# Patient Record
Sex: Male | Born: 1955 | Race: Black or African American | Hispanic: No | Marital: Single | Smoking: Never smoker
Health system: Southern US, Community
[De-identification: ages and names within clinical notes are randomized; demographics above are authoritative.]

## PROBLEM LIST (undated history)

## (undated) DIAGNOSIS — J45909 Unspecified asthma, uncomplicated: Secondary | ICD-10-CM

## (undated) DIAGNOSIS — I10 Essential (primary) hypertension: Secondary | ICD-10-CM

## (undated) DIAGNOSIS — M1990 Unspecified osteoarthritis, unspecified site: Secondary | ICD-10-CM

## (undated) DIAGNOSIS — E119 Type 2 diabetes mellitus without complications: Secondary | ICD-10-CM

## (undated) HISTORY — DX: Unspecified asthma, uncomplicated: J45.909

## (undated) HISTORY — DX: Essential (primary) hypertension: I10

## (undated) HISTORY — DX: Unspecified osteoarthritis, unspecified site: M19.90

## (undated) HISTORY — DX: Type 2 diabetes mellitus without complications: E11.9

## (undated) HISTORY — PX: OTHER SURGICAL HISTORY: SHX169

---

## 2004-01-18 ENCOUNTER — Emergency Department (HOSPITAL_COMMUNITY): Admission: EM | Admit: 2004-01-18 | Discharge: 2004-01-18 | Payer: Self-pay | Admitting: Emergency Medicine

## 2018-03-24 DIAGNOSIS — K219 Gastro-esophageal reflux disease without esophagitis: Secondary | ICD-10-CM | POA: Insufficient documentation

## 2018-06-02 DIAGNOSIS — R1310 Dysphagia, unspecified: Secondary | ICD-10-CM | POA: Insufficient documentation

## 2018-06-03 ENCOUNTER — Other Ambulatory Visit: Payer: Self-pay | Admitting: Physician Assistant

## 2018-06-03 DIAGNOSIS — R1310 Dysphagia, unspecified: Secondary | ICD-10-CM

## 2018-06-09 ENCOUNTER — Ambulatory Visit
Admission: RE | Admit: 2018-06-09 | Discharge: 2018-06-09 | Disposition: A | Discharge status: Home / Self Care | Payer: Managed Care, Other (non HMO) | Source: Ambulatory Visit | Attending: Physician Assistant | Admitting: Physician Assistant

## 2018-06-09 DIAGNOSIS — R1310 Dysphagia, unspecified: Secondary | ICD-10-CM

## 2018-06-18 ENCOUNTER — Other Ambulatory Visit: Payer: Self-pay | Admitting: Physician Assistant

## 2018-06-18 DIAGNOSIS — R1310 Dysphagia, unspecified: Secondary | ICD-10-CM

## 2018-06-30 ENCOUNTER — Ambulatory Visit
Admission: RE | Admit: 2018-06-30 | Discharge: 2018-06-30 | Disposition: A | Discharge status: Home / Self Care | Payer: Managed Care, Other (non HMO) | Source: Ambulatory Visit | Attending: Physician Assistant | Admitting: Physician Assistant

## 2018-06-30 DIAGNOSIS — R1310 Dysphagia, unspecified: Secondary | ICD-10-CM

## 2018-06-30 MED ORDER — IOPAMIDOL (ISOVUE-300) INJECTION 61%
75.0000 mL | Freq: Once | INTRAVENOUS | Status: AC | PRN
Start: 2018-06-30 — End: 2018-06-30
  Administered 2018-06-30: 75 mL via INTRAVENOUS

## 2020-02-29 ENCOUNTER — Ambulatory Visit: Payer: Self-pay | Admitting: Cardiology

## 2020-03-04 NOTE — Progress Notes (Signed)
Patient referred by Shanon Rosser, PA-C for chest pain  Subjective:   Geoffrey Williams, male    DOB: 1955-11-06, 64 y.o.   MRN: [id number]   Chief Complaint  Patient presents with  . Chest Pain  . New Patient (Initial Visit)     HPI  64 y.o. African American male with hypertension, hyperlipidemia, family h/o CAD, referred for evaluation of chest pain.  Patient was seen at [facility] urgent care on 02/16/2020 with complaints of chest pain. Patient works as a Patent attorney. On 02/16/2020, he had episodes of retrosternal and left sided chest discomfort, unrelated to exertion, associated with shortness of breath. Episodes were self limiting. He generally felt unwell that day. He has not had any recurrence of these symptoms. He does endorse exertional dyspnea.  He has strong family h/o CAD with multiple family members undergoing CABG at early age. He is on lisinopril 10 and amlodipine 5 mg with generally well controlled blood pressures. He was recently started on Crestor for management of hyperlipidemia.    Past Medical History:  Diagnosis Date  . Hypertension      Past Surgical History:  Procedure Laterality Date  . arm surgery       Social History   Tobacco Use  Smoking Status Never Smoker  Smokeless Tobacco Never Used    Social History   Substance and Sexual Activity  Alcohol Use Not Currently     Family History  Problem Relation Age of Onset  . Hypertension Mother   . Dementia Mother   . Hypertension Father   . Diabetes Father   . CAD Father   . CAD Brother   . Cancer Sister   . CAD Brother   . Parkinson's disease Brother   . Diabetes Brother      Current Outpatient Medications on File Prior to Visit  Medication Sig Dispense Refill  . amLODipine (NORVASC) 5 MG tablet Take 5 mg by mouth daily.    Marland Kitchen lisinopril (ZESTRIL) 10 MG tablet Take 10 mg by mouth daily.    . nitroGLYCERIN (NITROSTAT) 0.4 MG SL tablet SMARTSIG:1 Tablet(s) Sublingual PRN     . rosuvastatin (CRESTOR) 10 MG tablet Take 10 mg by mouth daily.     No current facility-administered medications on file prior to visit.    Cardiovascular and other pertinent studies:  EKG 03/07/2020: Sinus rhythm 64 bpm Normal EKG  Chest x-ray 02/18/2020: Normal heart size.  Blunting of right costophrenic angle consistent with either pleural scarring or pleural effusion.  Pleural plaque posteriorly which would be supportive of chronic pleural scarring. No definite evidence of CHF.    Recent labs: 02/16/2020: Glucose 154, BUN/Cr 11/1.17. EGFR 76. Na/K 139/4.5. Rest of the CMP normal H/H 15.9/46.5. MCV 88.7. Platelets 232 HbA1C N/A Chol 221, TG 210, HDL 42, LDL 144 TSH N/A   Review of Systems  Cardiovascular: Positive for chest pain and dyspnea on exertion. Negative for leg swelling, palpitations and syncope.         Vitals:   03/07/20 1340  BP: (!) 148/80  Pulse: 65  SpO2: 99%     Body mass index is 25.07 kg/m. Filed Weights   03/07/20 1340  Weight: 190 lb (86.2 kg)     Objective:   Physical Exam Vitals and nursing note reviewed.  Constitutional:      General: He is not in acute distress. Neck:     Vascular: No JVD.  Cardiovascular:     Rate and Rhythm:  Normal rate and regular rhythm.     Heart sounds: Normal heart sounds. No murmur heard.   Pulmonary:     Effort: Pulmonary effort is normal.     Breath sounds: Normal breath sounds. No wheezing or rales.            Assessment & Recommendations:    64 y.o. African American male with hypertension, hyperlipidemia, family h/o CAD, referred for evaluation of chest pain.  Chest pain: Atypical angina symptoms. Multiple risk factors for CAD, including hypertension, hyperlipidemia, family h/o CAD Recommend exercise nuclear stress test, echocardiogram, CT cardiac scoring Continue current antihypertensive and lipid lowering therapy, SL NTG, added Aspirin 81 mg   Further recommendations after  above testing  Thank you for referring the patient to [place]. Please feel free to contact with any questions.   [name], MD Pager: [phone] Office: [phone]

## 2020-03-07 ENCOUNTER — Other Ambulatory Visit: Payer: Self-pay

## 2020-03-07 ENCOUNTER — Encounter: Payer: Self-pay | Admitting: Cardiology

## 2020-03-07 ENCOUNTER — Ambulatory Visit: Payer: Managed Care, Other (non HMO) | Admitting: Cardiology

## 2020-03-07 VITALS — BP 148/80 | HR 65 | Ht 73.0 in | Wt 190.0 lb

## 2020-03-07 DIAGNOSIS — R0609 Other forms of dyspnea: Secondary | ICD-10-CM

## 2020-03-07 DIAGNOSIS — R072 Precordial pain: Secondary | ICD-10-CM | POA: Insufficient documentation

## 2020-03-07 DIAGNOSIS — E782 Mixed hyperlipidemia: Secondary | ICD-10-CM

## 2020-03-07 DIAGNOSIS — Z8249 Family history of ischemic heart disease and other diseases of the circulatory system: Secondary | ICD-10-CM

## 2020-03-07 DIAGNOSIS — I208 Other forms of angina pectoris: Secondary | ICD-10-CM

## 2020-03-07 DIAGNOSIS — R0600 Dyspnea, unspecified: Secondary | ICD-10-CM

## 2020-03-07 MED ORDER — ASPIRIN EC 81 MG PO TBEC: 81.0000 mg | DELAYED_RELEASE_TABLET | Freq: Every day | ORAL | 3 refills | Status: DC

## 2020-03-14 ENCOUNTER — Other Ambulatory Visit: Payer: Self-pay

## 2020-03-14 ENCOUNTER — Ambulatory Visit: Payer: Managed Care, Other (non HMO)

## 2020-03-14 DIAGNOSIS — R0600 Dyspnea, unspecified: Secondary | ICD-10-CM

## 2020-03-14 DIAGNOSIS — R0609 Other forms of dyspnea: Secondary | ICD-10-CM

## 2020-03-21 ENCOUNTER — Other Ambulatory Visit: Payer: Self-pay

## 2020-03-21 ENCOUNTER — Ambulatory Visit: Payer: Managed Care, Other (non HMO)

## 2020-03-21 DIAGNOSIS — I208 Other forms of angina pectoris: Secondary | ICD-10-CM

## 2020-03-30 ENCOUNTER — Encounter: Payer: Self-pay | Admitting: Cardiology

## 2020-03-30 ENCOUNTER — Ambulatory Visit: Payer: Managed Care, Other (non HMO) | Admitting: Cardiology

## 2020-03-30 ENCOUNTER — Other Ambulatory Visit: Payer: Self-pay

## 2020-03-30 VITALS — BP 168/95 | HR 56 | Resp 16 | Ht 73.0 in | Wt 191.0 lb

## 2020-03-30 DIAGNOSIS — R0600 Dyspnea, unspecified: Secondary | ICD-10-CM

## 2020-03-30 DIAGNOSIS — I10 Essential (primary) hypertension: Secondary | ICD-10-CM

## 2020-03-30 DIAGNOSIS — I2510 Atherosclerotic heart disease of native coronary artery without angina pectoris: Secondary | ICD-10-CM

## 2020-03-30 DIAGNOSIS — J984 Other disorders of lung: Secondary | ICD-10-CM

## 2020-03-30 DIAGNOSIS — R0609 Other forms of dyspnea: Secondary | ICD-10-CM

## 2020-03-30 MED ORDER — HYDROCHLOROTHIAZIDE 25 MG PO TABS: 25.0000 mg | ORAL_TABLET | Freq: Every day | ORAL | 3 refills | Status: DC

## 2020-03-30 MED ORDER — ROSUVASTATIN CALCIUM 20 MG PO TABS: 20.0000 mg | ORAL_TABLET | Freq: Every day | ORAL | 2 refills | Status: DC

## 2020-03-30 MED ORDER — AMLODIPINE BESYLATE 5 MG PO TABS: 5.0000 mg | ORAL_TABLET | Freq: Every day | ORAL | 2 refills | Status: DC

## 2020-03-30 NOTE — Progress Notes (Signed)
Patient referred by No ref. provider found for chest pain  Subjective:   Geoffrey Williams, male    DOB: 31-Mar-1956, 64 y.o.   MRN: [id number]   Chief Complaint  Patient presents with  . Follow-up  . Results  . Precordial pain     HPI  64 y.o. African American male with hypertension, hyperlipidemia, family h/o CAD, referred for evaluation of chest pain.  Patient has not had any recurrence of chest pain since his last visit.  He does report exertional dyspnea, which usually correlates with him getting sick with Covid and late 2020.  Blood pressure remains elevated.  He is not currently taking amlodipine since he ran out.  He is taking lisinopril 10 mg daily.  Recent CT scan, echocardiogram results reviewed with the patient, details below.    Initial consultation HPI 02/2020: Patient was seen at [facility] urgent care on 02/16/2020 with complaints of chest pain. Patient works as a Patent attorney. On 02/16/2020, he had episodes of retrosternal and left sided chest discomfort, unrelated to exertion, associated with shortness of breath. Episodes were self limiting. He generally felt unwell that day. He has not had any recurrence of these symptoms. He does endorse exertional dyspnea.  He has strong family h/o CAD with multiple family members undergoing CABG at early age. He is on lisinopril 10 and amlodipine 5 mg with generally well controlled blood pressures. He was recently started on Crestor for management of hyperlipidemia.     Current Outpatient Medications on File Prior to Visit  Medication Sig Dispense Refill  . aspirin EC 81 MG tablet Take 1 tablet (81 mg total) by mouth daily. Swallow whole. 30 tablet 3  . lisinopril (ZESTRIL) 10 MG tablet Take 10 mg by mouth daily.    . nitroGLYCERIN (NITROSTAT) 0.4 MG SL tablet SMARTSIG:1 Tablet(s) Sublingual PRN    . rosuvastatin (CRESTOR) 10 MG tablet Take 10 mg by mouth daily.    Marland Kitchen amLODipine (NORVASC) 5 MG tablet Take 5 mg by mouth  daily.     No current facility-administered medications on file prior to visit.    Cardiovascular and other pertinent studies:  Exercise Sestamibi Stress Test 03/21/2020: Equivocal ECG stress. The patient exercised for 7 minutes and 0 seconds of a Bruce protocol, achieving approximately 8.15 METs. Stress terminated due to fatigue and dyspnea.  Peak BP was 252/82 mmHg, which is a hypertensive response to exercise. Peak EKG/ECG demonstrated normal sinus rhythm. Non-specific ST-T abnormality. During exercise the peak ECG revealed premature ventricular contractions, occasional ventricular couplets and 2 ventricular triplets. No ST-T changes of ischemia. Myocardial perfusion is normal. Overall LV systolic function is normal without regional wall motion abnormalities. Stress LV EF: 63%.  No previous exam available for comparison. Low risk study. Clinical correlation recommended.   Echocardiogram 03/14/2020:  Left ventricle cavity is normal in size and wall thickness. Normal global  wall motion. Normal LV systolic function with EF 68%. Normal diastolic  filling pattern.  Structurally normal trileaflet aortic valve. Trace aortic regurgitation.  Mild tricuspid regurgitation.  No evidence of pulmonary hypertension.  No significant change compared to previous study in 2018.   CT Cardiac scoring 03/07/2020: Total score: 84.  LM: 33.  LAD: 36.  Cx: 15.  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   EKG  03/07/2020: Sinus rhythm 64 bpm Normal EKG  Chest x-ray 02/18/2020: Normal heart size.  Blunting of right costophrenic angle consistent with either pleural scarring or pleural effusion.  Pleural plaque posteriorly which would be supportive of chronic pleural scarring. No definite  evidence of CHF.    Recent labs: 02/16/2020: Glucose 154, BUN/Cr 11/1.17. EGFR 76. Na/K 139/4.5. Rest of the CMP normal H/H 15.9/46.5. MCV 88.7. Platelets 232 HbA1C N/A Chol 221, TG 210, HDL 42, LDL 144 TSH N/A   Review of Systems  Cardiovascular: Positive for chest pain and dyspnea on exertion. Negative for leg swelling, palpitations and syncope.         Vitals:   03/30/20 1545 03/30/20 1547  BP: (!) 175/92 (!) 168/95  Pulse: (!) 58 (!) 56  Resp: 16   SpO2: 97% 96%     Body mass index is 25.2 kg/m. Filed Weights   03/30/20 1545  Weight: 191 lb (86.6 kg)     Objective:   Physical Exam Vitals and nursing note reviewed.  Constitutional:      General: He is not in acute distress. Neck:     Vascular: No JVD.  Cardiovascular:     Rate and Rhythm: Normal rate and regular rhythm.     Heart sounds: Normal heart sounds. No murmur heard.   Pulmonary:     Effort: Pulmonary effort is normal.     Breath sounds: Normal breath sounds. No wheezing or rales.            Assessment & Recommendations:    64 y.o. African American male with hypertension, hyperlipidemia, coronary artery disease without ischemia, exertional dyspnea.  Exertional dyspnea: Could be multifactorial.  While he does have coronary artery disease, as evident on CT cardiac scoring, he does not have any ischemia on stress testing, suggesting nonobstructive coronary artery disease.  He does have pulmonary calcifications, raising the possibility of asbestos exposure. Other differential could be long COVID. Will refer him to pulmonology.   CAD without angina: Continue Aspirin 81 mg. Increase Crestor to 20 mg daily.  Repeat lipid panel in 4 weeks  Hypertension: Uncontrolled. Change lisinopril 10 mg to HCTZ 25 mg, resume amlodipine 5 mg. Will recheck blood pressure at next visit.     [name], MD Pager: [phone] Office: [phone]

## 2020-04-04 ENCOUNTER — Ambulatory Visit: Payer: Managed Care, Other (non HMO) | Admitting: Cardiology

## 2020-04-06 ENCOUNTER — Ambulatory Visit: Payer: Managed Care, Other (non HMO) | Admitting: Cardiology

## 2020-04-20 LAB — LIPID PANEL
Chol/HDL Ratio: 3.4 ratio (ref 0.0–5.0)
Cholesterol, Total: 120 mg/dL (ref 100–199)
HDL: 35 mg/dL — ABNORMAL LOW (ref >39)
LDL Chol Calc (NIH): 62 mg/dL (ref 0–99)
Triglycerides: 131 mg/dL (ref 0–149)
VLDL Cholesterol Cal: 23 mg/dL (ref 5–40)

## 2020-04-29 NOTE — Progress Notes (Signed)
Patient referred by No ref. provider found for chest pain  Subjective:   Geoffrey Williams, male    DOB: 1955/08/22, 65 y.o.   MRN: [id number]   Chief Complaint  Patient presents with   Coronary Artery Disease     HPI  65 y.o. African American male with hypertension, hyperlipidemia, coronary artery disease without ischemia, exertional dyspnea.  Reviewed lipid panel results with the patient, details below. He has not had any recurrent chest pain, shortness of breath symptoms. Blood pressure is better controlled.    Current Outpatient Medications on File Prior to Visit  Medication Sig Dispense Refill   amLODipine (NORVASC) 5 MG tablet Take 1 tablet (5 mg total) by mouth daily. 60 tablet 2   aspirin EC 81 MG tablet Take 1 tablet (81 mg total) by mouth daily. Swallow whole. 30 tablet 3   hydrochlorothiazide (HYDRODIURIL) 25 MG tablet Take 1 tablet (25 mg total) by mouth daily. 60 tablet 3   nitroGLYCERIN (NITROSTAT) 0.4 MG SL tablet SMARTSIG:1 Tablet(s) Sublingual PRN     rosuvastatin (CRESTOR) 20 MG tablet Take 1 tablet (20 mg total) by mouth daily. 60 tablet 2   No current facility-administered medications on file prior to visit.    Cardiovascular and other pertinent studies:  Exercise Sestamibi Stress Test 03/21/2020: Equivocal ECG stress. The patient exercised for 7 minutes and 0 seconds of a Bruce protocol, achieving approximately 8.15 METs. Stress terminated due to fatigue and dyspnea.  Peak BP was 252/82 mmHg, which is a hypertensive response to exercise. Peak EKG/ECG demonstrated normal sinus rhythm. Non-specific ST-T abnormality. During exercise the peak ECG revealed premature ventricular contractions, occasional ventricular couplets and 2 ventricular triplets. No ST-T changes of ischemia. Myocardial perfusion is normal. Overall LV systolic function is normal without regional wall motion abnormalities. Stress LV EF: 63%.  No previous exam available for comparison. Low  risk study. Clinical correlation recommended.   Echocardiogram 03/14/2020:  Left ventricle cavity is normal in size and wall thickness. Normal global  wall motion. Normal LV systolic function with EF 68%. Normal diastolic  filling pattern.  Structurally normal trileaflet aortic valve. Trace aortic regurgitation.  Mild tricuspid regurgitation.  No evidence of pulmonary hypertension.  No significant change compared to previous study in 2018.   CT Cardiac scoring 03/07/2020: Total score: 84.  LM: 33.  LAD: 36.  Cx: 15.  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   EKG 03/07/2020: Sinus rhythm 64 bpm Normal EKG  Chest x-ray 02/18/2020: Normal heart size.  Blunting of right costophrenic angle consistent with either pleural scarring or pleural effusion.  Pleural plaque posteriorly which would be supportive of chronic pleural scarring. No definite evidence of CHF.    Recent labs: 04/19/2020: Chol 120 TG 131, HDL 35, LDL 62  02/16/2020: Glucose 154, BUN/Cr 11/1.17. EGFR 76. Na/K 139/4.5. Rest of the CMP normal H/H 15.9/46.5. MCV 88.7. Platelets 232 HbA1C N/A Chol 221, TG 210, HDL 42, LDL 144 TSH N/A   Review of Systems  Cardiovascular: Positive for chest pain and dyspnea on exertion. Negative for leg swelling, palpitations and syncope.         Vitals:   05/02/20 1452  BP: 127/71  Pulse: 63  SpO2: 98%     Body mass index is 25.07 kg/m. Filed Weights   05/02/20  1452  Weight: 190 lb (86.2 kg)     Objective:   Physical Exam Vitals and nursing note reviewed.  Constitutional:      General: He is not in acute distress. Neck:     Vascular: No JVD.  Cardiovascular:     Rate and Rhythm: Normal rate and regular rhythm.     Heart sounds: Normal heart  sounds. No murmur heard.   Pulmonary:     Effort: Pulmonary effort is normal.     Breath sounds: Normal breath sounds. No wheezing or rales.            Assessment & Recommendations:    65 y.o. African American male with hypertension, hyperlipidemia, coronary artery disease without ischemia, exertional dyspnea.  Exertional dyspnea: Improved. CAD seen on CT cardiac scoring, without ischemia on stress testing s/o nonobstructive CAD. Will refer to pulmonology given incidental finding of pleural calcification.  CAD without angina: Continue Aspirin 81 mg. Increase Crestor to 20 mg daily.  Lipids now well controlled.   Hypertension: Much better controlled.  F/u in 6 months   [name], MD Pager: [phone] Office: [phone]

## 2020-05-02 ENCOUNTER — Encounter: Payer: Self-pay | Admitting: Cardiology

## 2020-05-02 ENCOUNTER — Ambulatory Visit: Payer: Managed Care, Other (non HMO) | Admitting: Cardiology

## 2020-05-02 ENCOUNTER — Other Ambulatory Visit: Payer: Self-pay

## 2020-05-02 VITALS — BP 127/71 | HR 63 | Ht 73.0 in | Wt 190.0 lb

## 2020-05-02 DIAGNOSIS — I208 Other forms of angina pectoris: Secondary | ICD-10-CM

## 2020-05-02 DIAGNOSIS — I2510 Atherosclerotic heart disease of native coronary artery without angina pectoris: Secondary | ICD-10-CM

## 2020-05-02 DIAGNOSIS — I10 Essential (primary) hypertension: Secondary | ICD-10-CM

## 2020-05-02 DIAGNOSIS — E782 Mixed hyperlipidemia: Secondary | ICD-10-CM

## 2020-05-02 DIAGNOSIS — J948 Other specified pleural conditions: Secondary | ICD-10-CM

## 2020-05-02 MED ORDER — ASPIRIN EC 81 MG PO TBEC: 81.0000 mg | DELAYED_RELEASE_TABLET | Freq: Every day | ORAL | 3 refills | Status: DC

## 2020-08-15 ENCOUNTER — Encounter: Payer: Self-pay | Admitting: Pulmonary Disease

## 2020-08-15 ENCOUNTER — Ambulatory Visit: Payer: Managed Care, Other (non HMO) | Admitting: Pulmonary Disease

## 2020-08-15 ENCOUNTER — Other Ambulatory Visit: Payer: Self-pay

## 2020-08-15 VITALS — BP 122/78 | HR 77 | Temp 97.5°F | Ht 73.0 in | Wt 180.0 lb

## 2020-08-15 DIAGNOSIS — R0600 Dyspnea, unspecified: Secondary | ICD-10-CM | POA: Diagnosis not present

## 2020-08-15 DIAGNOSIS — M2550 Pain in unspecified joint: Secondary | ICD-10-CM | POA: Insufficient documentation

## 2020-08-15 DIAGNOSIS — F329 Major depressive disorder, single episode, unspecified: Secondary | ICD-10-CM | POA: Insufficient documentation

## 2020-08-15 DIAGNOSIS — F32A Depression, unspecified: Secondary | ICD-10-CM | POA: Insufficient documentation

## 2020-08-15 DIAGNOSIS — R0609 Other forms of dyspnea: Secondary | ICD-10-CM

## 2020-08-15 DIAGNOSIS — E559 Vitamin D deficiency, unspecified: Secondary | ICD-10-CM | POA: Insufficient documentation

## 2020-08-15 DIAGNOSIS — J0140 Acute pansinusitis, unspecified: Secondary | ICD-10-CM | POA: Insufficient documentation

## 2020-08-15 DIAGNOSIS — J309 Allergic rhinitis, unspecified: Secondary | ICD-10-CM | POA: Insufficient documentation

## 2020-08-15 NOTE — Progress Notes (Signed)
Geoffrey Williams    [id number]    14-Oct-1955  Primary Care Physician:Patient, No Pcp Per (Inactive)  Referring Physician: Elder Negus, MD [address] [place],  [place] [address]  Chief complaint: Consult for abnormal lung imaging, post COVID-24  HPI: 65 year old with history of childhood asthma, hypertension Referred here for evaluation of pleural calcifications found on recent CT chest.  Has history of childhood asthma, history of COVID-11 April 2019.  He was not hospitalized for this Had some mild dyspnea on exertion after feels which has improved but continues to have some residual symptoms.  Denies any cough, wheezing, sputum production  Pets: No pets Occupation: Estate agent Exposures: No mold, hot tub, Jacuzzi.  No feather pillows or comforters Smoking history: Never smoker Travel history: No significant travel history Relevant family history: No family history of lung disease   Outpatient Encounter Medications as of 08/15/2020  Medication Sig  . amLODipine (NORVASC) 5 MG tablet Take 1 tablet (5 mg total) by mouth daily.  Marland Kitchen aspirin EC 81 MG tablet Take 1 tablet (81 mg total) by mouth daily. Swallow whole.  . hydrochlorothiazide (HYDRODIURIL) 25 MG tablet Take 1 tablet (25 mg total) by mouth daily.  Marland Kitchen lisinopril (ZESTRIL) 10 MG tablet Take 10 mg by mouth daily.  . nitroGLYCERIN (NITROSTAT) 0.4 MG SL tablet SMARTSIG:1 Tablet(s) Sublingual PRN  . rosuvastatin (CRESTOR) 20 MG tablet Take 1 tablet (20 mg total) by mouth daily.   No facility-administered encounter medications on file as of 08/15/2020.    Allergies as of 08/15/2020  . (No Known Allergies)    Past Medical History:  Diagnosis Date  . Asthma   . Hypertension     Past Surgical History:  Procedure Laterality Date  . arm surgery Right     Family History  Problem Relation Age of Onset  . Hypertension Mother   . Dementia Mother   . Hypertension Father   .  Diabetes Father   . CAD Father   . CAD Brother   . Cancer Sister   . CAD Brother   . Parkinson's disease Brother   . Diabetes Brother     Social History   Socioeconomic History  . Marital status: Single    Spouse name: Not on file  . Number of children: Not on file  . Years of education: Not on file  . Highest education level: Not on file  Occupational History  . Not on file  Tobacco Use  . Smoking status: Never Smoker  . Smokeless tobacco: Never Used  Vaping Use  . Vaping Use: Never used  Substance and Sexual Activity  . Alcohol use: Not Currently  . Drug use: Not Currently    Types: Marijuana  . Sexual activity: Yes  Other Topics Concern  . Not on file  Social History Narrative  . Not on file   Social Determinants of Health   Financial Resource Strain: Not on file  Food Insecurity: Not on file  Transportation Needs: Not on file  Physical Activity: Not on file  Stress: Not on file  Social Connections: Not on file  Intimate Partner Violence: Not on file    Review of systems: Review of Systems  Constitutional: Negative for fever and chills.  HENT: Negative.   Eyes: Negative for blurred vision.  Respiratory: as per HPI  Cardiovascular: Negative for chest pain and palpitations.  Gastrointestinal: Negative for vomiting, diarrhea, blood per rectum. Genitourinary: Negative for  dysuria, urgency, frequency and hematuria.  Musculoskeletal: Negative for myalgias, back pain and joint pain.  Skin: Negative for itching and rash.  Neurological: Negative for dizziness, tremors, focal weakness, seizures and loss of consciousness.  Endo/Heme/Allergies: Negative for environmental allergies.  Psychiatric/Behavioral: Negative for depression, suicidal ideas and hallucinations.  All other systems reviewed and are negative.  Physical Exam: Blood pressure 122/78, pulse 77, temperature (!) 97.5 F (36.4 C), temperature source Temporal, height 6\' 1"  (1.854 m), weight 180 lb (81.6  kg), SpO2 99 %. Gen:      No acute distress HEENT:  EOMI, sclera anicteric Neck:     No masses; no thyromegaly Lungs:    Clear to auscultation bilaterally; normal respiratory effort CV:         Regular rate and rhythm; no murmurs Abd:      + bowel sounds; soft, non-tender; no palpable masses, no distension Ext:    No edema; adequate peripheral perfusion Skin:      Warm and dry; no rash Neuro: alert and oriented x 3 Psych: normal mood and affect  Data Reviewed: Imaging: CT coronaries [facility] 03/07/2020- by report bilateral pleural calcifications suggestive of asbestos exposure.  PFTs:  Labs:  Assessment:  Abnormal CT By report he has pleural calcification suggestive of asbestos exposure though he denies any known known exposure History notable For childhood asthma which he grew out of and COVID in 2020  We will schedule high-resolution CT and PFTs for better evaluation Follow-up in clinic after these tests for review.  Plan/Recommendations: High-res CT, PFTs  2021 MD [facility] 08/15/2020, 11:00 AM  CC: Patwardhan, 08/17/2020, MD

## 2020-08-15 NOTE — Patient Instructions (Signed)
We will get a CT chest high-resolution and lung function test for further evaluation for lung Follow-up in clinic after these tests for review and assessment

## 2020-08-24 ENCOUNTER — Other Ambulatory Visit: Payer: Managed Care, Other (non HMO)

## 2020-08-29 ENCOUNTER — Other Ambulatory Visit: Payer: Self-pay

## 2020-08-29 ENCOUNTER — Ambulatory Visit (INDEPENDENT_AMBULATORY_CARE_PROVIDER_SITE_OTHER)
Admission: RE | Admit: 2020-08-29 | Discharge: 2020-08-29 | Disposition: A | Discharge status: Home / Self Care | Payer: Managed Care, Other (non HMO) | Source: Ambulatory Visit | Attending: Pulmonary Disease | Admitting: Pulmonary Disease

## 2020-08-29 DIAGNOSIS — R0600 Dyspnea, unspecified: Secondary | ICD-10-CM | POA: Diagnosis not present

## 2020-08-29 DIAGNOSIS — R0609 Other forms of dyspnea: Secondary | ICD-10-CM

## 2020-09-07 ENCOUNTER — Telehealth: Payer: Self-pay | Admitting: Pulmonary Disease

## 2020-09-07 NOTE — Telephone Encounter (Signed)
lmtcb for pt.    CT shows mild scarring and calcium buildup in the lining of the lung. 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 No treatment recommended at current time  Will review in detail after he follows up in clinic with PFTs

## 2020-09-07 NOTE — Progress Notes (Signed)
LMTCB

## 2020-09-08 NOTE — Telephone Encounter (Signed)
Lmtcb for pt.  

## 2020-09-08 NOTE — Telephone Encounter (Signed)
Patient returned call, advised him of results/recommendations of CT scan per Dr. Isaiah Serge.  He verbalized understanding.  Nothing further needed.

## 2020-10-14 ENCOUNTER — Other Ambulatory Visit (HOSPITAL_COMMUNITY)
Admission: RE | Admit: 2020-10-14 | Discharge: 2020-10-14 | Disposition: A | Discharge status: Home / Self Care | Payer: Managed Care, Other (non HMO) | Source: Ambulatory Visit | Attending: Pulmonary Disease | Admitting: Pulmonary Disease

## 2020-10-14 DIAGNOSIS — Z20822 Contact with and (suspected) exposure to covid-19: Secondary | ICD-10-CM | POA: Diagnosis not present

## 2020-10-14 DIAGNOSIS — Z01812 Encounter for preprocedural laboratory examination: Secondary | ICD-10-CM | POA: Diagnosis not present

## 2020-10-14 LAB — SARS CORONAVIRUS 2 (TAT 6-24 HRS): SARS Coronavirus 2: NEGATIVE

## 2020-10-17 ENCOUNTER — Encounter: Payer: Self-pay | Admitting: Pulmonary Disease

## 2020-10-17 ENCOUNTER — Ambulatory Visit (INDEPENDENT_AMBULATORY_CARE_PROVIDER_SITE_OTHER): Payer: Managed Care, Other (non HMO) | Admitting: Pulmonary Disease

## 2020-10-17 ENCOUNTER — Ambulatory Visit: Payer: Managed Care, Other (non HMO) | Admitting: Pulmonary Disease

## 2020-10-17 ENCOUNTER — Other Ambulatory Visit: Payer: Self-pay

## 2020-10-17 VITALS — BP 124/80 | HR 66 | Ht 73.0 in | Wt 180.0 lb

## 2020-10-17 DIAGNOSIS — J61 Pneumoconiosis due to asbestos and other mineral fibers: Secondary | ICD-10-CM | POA: Diagnosis not present

## 2020-10-17 DIAGNOSIS — R0600 Dyspnea, unspecified: Secondary | ICD-10-CM | POA: Diagnosis not present

## 2020-10-17 DIAGNOSIS — J849 Interstitial pulmonary disease, unspecified: Secondary | ICD-10-CM | POA: Diagnosis not present

## 2020-10-17 DIAGNOSIS — R0609 Other forms of dyspnea: Secondary | ICD-10-CM

## 2020-10-17 LAB — PULMONARY FUNCTION TEST
DL/VA % pred: 122 %
DL/VA: 5.06 ml/min/mmHg/L
DLCO cor % pred: 73 %
DLCO cor: 20.72 ml/min/mmHg
DLCO unc % pred: 73 %
DLCO unc: 20.72 ml/min/mmHg
FEF 25-75 Post: 2.7 L/sec
FEF 25-75 Pre: 2.33 L/sec
FEF2575-%Change-Post: 15 %
FEF2575-%Pred-Post: 92 %
FEF2575-%Pred-Pre: 79 %
FEV1-%Change-Post: 4 %
FEV1-%Pred-Post: 74 %
FEV1-%Pred-Pre: 71 %
FEV1-Post: 2.42 L
FEV1-Pre: 2.31 L
FEV1FVC-%Change-Post: 5 %
FEV1FVC-%Pred-Pre: 107 %
FEV6-%Change-Post: -1 %
FEV6-%Pred-Post: 67 %
FEV6-%Pred-Pre: 68 %
FEV6-Post: 2.74 L
FEV6-Pre: 2.77 L
FEV6FVC-%Pred-Post: 103 %
FEV6FVC-%Pred-Pre: 103 %
FVC-%Change-Post: -1 %
FVC-%Pred-Post: 65 %
FVC-%Pred-Pre: 66 %
FVC-Post: 2.74 L
FVC-Pre: 2.77 L
Post FEV1/FVC ratio: 88 %
Post FEV6/FVC ratio: 100 %
Pre FEV1/FVC ratio: 83 %
Pre FEV6/FVC Ratio: 100 %
RV % pred: 71 %
RV: 1.71 L
TLC % pred: 61 %
TLC: 4.5 L

## 2020-10-17 NOTE — Progress Notes (Signed)
PFT done today. 

## 2020-10-17 NOTE — Patient Instructions (Signed)
I have reviewed your CT scan and PFTs which show some scarring which may be from asbestos exposure We will continue to monitor this closely Follow-up high-res CT in 6 months Return to clinic after CT scan

## 2020-10-17 NOTE — Progress Notes (Signed)
Geoffrey Williams    [id number]    [birth year]  Primary Care Physician:Patient, No Pcp Per (Inactive)  Referring Physician: No referring provider defined for this encounter.  Chief complaint: Consult for abnormal lung imaging, post COVID-59  HPI: 65 year old with history of childhood asthma, hypertension Referred here for evaluation of pleural calcifications found on recent CT chest.  Has history of childhood asthma, history of COVID-11 April 2019.  He was not hospitalized for this Had some mild dyspnea on exertion after feels which has improved but continues to have some residual symptoms.  Denies any cough, wheezing, sputum production  Pets: No pets Occupation: Estate agent Exposures: No mold, hot tub, Jacuzzi.  No feather pillows or comforters Smoking history: Never smoker Travel history: No significant travel history Relevant family history: No family history of lung disease   Outpatient Encounter Medications as of 10/17/2020  Medication Sig   amLODipine (NORVASC) 5 MG tablet Take 1 tablet (5 mg total) by mouth daily.   aspirin EC 81 MG tablet Take 1 tablet (81 mg total) by mouth daily. Swallow whole.   lisinopril (ZESTRIL) 10 MG tablet Take 10 mg by mouth daily.   nitroGLYCERIN (NITROSTAT) 0.4 MG SL tablet SMARTSIG:1 Tablet(s) Sublingual PRN   rosuvastatin (CRESTOR) 20 MG tablet Take 1 tablet (20 mg total) by mouth daily.   [DISCONTINUED] hydrochlorothiazide (HYDRODIURIL) 25 MG tablet Take 1 tablet (25 mg total) by mouth daily.   No facility-administered encounter medications on file as of 10/17/2020.    Allergies as of 10/17/2020   (No Known Allergies)    Past Medical History:  Diagnosis Date   Asthma    Hypertension     Past Surgical History:  Procedure Laterality Date   arm surgery Right     Family History  Problem Relation Age of Onset   Hypertension Mother    Dementia Mother    Hypertension Father    Diabetes Father    CAD Father     CAD Brother    Cancer Sister    CAD Brother    Parkinson's disease Brother    Diabetes Brother     Social History   Socioeconomic History   Marital status: Single    Spouse name: Not on file   Number of children: Not on file   Years of education: Not on file   Highest education level: Not on file  Occupational History   Not on file  Tobacco Use   Smoking status: Never   Smokeless tobacco: Never  Vaping Use   Vaping Use: Never used  Substance and Sexual Activity   Alcohol use: Not Currently   Drug use: Not Currently    Types: Marijuana   Sexual activity: Yes  Other Topics Concern   Not on file  Social History Narrative   Not on file   Social Determinants of Health   Financial Resource Strain: Not on file  Food Insecurity: Not on file  Transportation Needs: Not on file  Physical Activity: Not on file  Stress: Not on file  Social Connections: Not on file  Intimate Partner Violence: Not on file    Review of systems: Review of Systems  Constitutional: Negative for fever and chills.  HENT: Negative.   Eyes: Negative for blurred vision.  Respiratory: as per HPI  Cardiovascular: Negative for chest pain and palpitations.  Gastrointestinal: Negative for vomiting, diarrhea, blood per rectum. Genitourinary: Negative for dysuria, urgency, frequency and hematuria.  Musculoskeletal: Negative  for myalgias, back pain and joint pain.  Skin: Negative for itching and rash.  Neurological: Negative for dizziness, tremors, focal weakness, seizures and loss of consciousness.  Endo/Heme/Allergies: Negative for environmental allergies.  Psychiatric/Behavioral: Negative for depression, suicidal ideas and hallucinations.  All other systems reviewed and are negative.  Physical Exam: Blood pressure 122/78, pulse 77, temperature (!) 97.5 F (36.4 C), temperature source Temporal, height 6\' 1"  (1.854 m), weight 180 lb (81.6 kg), SpO2 99 %. Gen:      No acute distress HEENT:  EOMI,  sclera anicteric Neck:     No masses; no thyromegaly Lungs:    Clear to auscultation bilaterally; normal respiratory effort CV:         Regular rate and rhythm; no murmurs Abd:      + bowel sounds; soft, non-tender; no palpable masses, no distension Ext:    No edema; adequate peripheral perfusion Skin:      Warm and dry; no rash Neuro: alert and oriented x 3 Psych: normal mood and affect  Data Reviewed: Imaging: CT coronaries [facility] 03/07/2020- by report bilateral pleural calcifications suggestive of asbestos exposure.  PFTs: 10/17/2020 FVC 2.74 [65%], FEV1 2.42 [74%], F/F 88, TLC 4.50 [61%], DLCO 20.72 [73%] Moderate restriction with minimal diffusion defect  Labs:  Assessment:  Asbestosis His CT scan shows pleural calcification and scarring at the base suggestive of asbestos exposure though he denies any known known exposure History notable for childhood asthma which he grew out of and COVID in 2020  Reviewed PFTs with restriction and diffusion impairment No treatment needed at present unless there is progression of fibrosis Follow-up with high-res CT in 6 months  Plan/Recommendations: High-res CT in 6 months  2021 MD [facility] 10/17/2020, 11:13 AM  CC: No ref. provider found

## 2020-10-24 ENCOUNTER — Other Ambulatory Visit: Payer: Self-pay | Admitting: Cardiology

## 2020-10-24 DIAGNOSIS — I10 Essential (primary) hypertension: Secondary | ICD-10-CM

## 2020-10-31 ENCOUNTER — Ambulatory Visit: Payer: Managed Care, Other (non HMO) | Admitting: Cardiology

## 2020-11-21 ENCOUNTER — Ambulatory Visit: Payer: Managed Care, Other (non HMO) | Admitting: Cardiology

## 2020-11-21 ENCOUNTER — Other Ambulatory Visit: Payer: Self-pay

## 2020-11-21 ENCOUNTER — Encounter: Payer: Self-pay | Admitting: Cardiology

## 2020-11-21 VITALS — BP 150/81 | HR 65 | Temp 97.8°F | Resp 16 | Ht 73.0 in | Wt 170.0 lb

## 2020-11-21 DIAGNOSIS — I10 Essential (primary) hypertension: Secondary | ICD-10-CM

## 2020-11-21 DIAGNOSIS — I2510 Atherosclerotic heart disease of native coronary artery without angina pectoris: Secondary | ICD-10-CM

## 2020-11-21 DIAGNOSIS — E782 Mixed hyperlipidemia: Secondary | ICD-10-CM

## 2020-11-21 NOTE — Progress Notes (Signed)
Subjective:   Kary Kos, male    DOB: November 27, 1955, 65 y.o.   MRN: [id number]   Chief Complaint  Patient presents with   Coronary artery disease involving native coronary artery of   Hypertension   Follow-up    6 MONTH     HPI  65 y.o. African American male with hypertension, hyperlipidemia, coronary artery disease   Patient is doing well, he has not had any recent chest pain symptoms.  His exertional dyspnea is mild and occasional.  He saw pulmonology given abnormal findings on CT scan suggesting possible asbestos exposure.  Blood pressure is elevated today, but usually well controlled.  2 months ago, patient had an episode of fast heart rate while laying in bed at night.  This episode has not recurred.  Current Outpatient Medications on File Prior to Visit  Medication Sig Dispense Refill   amLODipine (NORVASC) 5 MG tablet TAKE 1 TABLET(5 MG) BY MOUTH DAILY 60 tablet 2   aspirin EC 81 MG tablet Take 1 tablet (81 mg total) by mouth daily. Swallow whole. 90 tablet 3   hydrochlorothiazide (HYDRODIURIL) 25 MG tablet TAKE 1 TABLET(25 MG) BY MOUTH DAILY 60 tablet 3   lisinopril (ZESTRIL) 10 MG tablet Take 10 mg by mouth daily.     nitroGLYCERIN (NITROSTAT) 0.4 MG SL tablet SMARTSIG:1 Tablet(s) Sublingual PRN     rosuvastatin (CRESTOR) 20 MG tablet Take 1 tablet (20 mg total) by mouth daily. 60 tablet 2   No current facility-administered medications on file prior to visit.    Cardiovascular and other pertinent studies:  EKG 11/21/2020: Sinus rhythm 60 bpm Normal EKG  Exercise Sestamibi Stress Test 03/21/2020: Equivocal ECG stress. The patient exercised for 7 minutes and 0 seconds of a Bruce protocol, achieving approximately 8.15 METs. Stress terminated due to fatigue and dyspnea.  Peak BP was 252/82 mmHg, which is a hypertensive response to exercise. Peak EKG/ECG demonstrated normal sinus rhythm. Non-specific ST-T abnormality. During exercise the peak ECG revealed premature  ventricular contractions, occasional ventricular couplets and 2 ventricular triplets. No ST-T changes of ischemia. Myocardial perfusion is normal. Overall LV systolic function is normal without regional wall motion abnormalities. Stress LV EF: 63%.  No previous exam available for comparison. Low risk study. Clinical correlation recommended.   Echocardiogram 03/14/2020:  Left ventricle cavity is normal in size and wall thickness. Normal global  wall motion. Normal LV systolic function with EF 68%. Normal diastolic  filling pattern.  Structurally normal trileaflet aortic valve.  Trace aortic regurgitation.  Mild tricuspid regurgitation.  No evidence of pulmonary hypertension.  No significant change compared to previous study in 2018.   CT Cardiac scoring 03/07/2020: Total score: 84.  LM: 33.  LAD: 36.  Cx: 15.  RCA: 0  PDA: 0   Cardiac anatomy:  Normal heart size, no evidence of pericardial effusion  Mediastinum: No adenopathy  Visible abdomen: No significant abnormality  Visible lung fields: Bilateral pleural calcifications suggestive of asbestos exposure.   EKG 03/07/2020: Sinus rhythm 64 bpm Normal EKG  Chest x-ray 02/18/2020: Normal heart size.  Blunting of right costophrenic angle consistent with either pleural scarring or pleural effusion.  Pleural plaque posteriorly which would be supportive of chronic pleural scarring. No definite evidence of CHF.    Recent labs: 04/19/2020: Chol 120 TG 131, HDL 35, LDL 62  02/16/2020: Glucose 154, BUN/Cr 11/1.17. EGFR 76. Na/K 139/4.5. Rest of the CMP normal H/H 15.9/46.5. MCV 88.7. Platelets 232 HbA1C N/A Chol 221, TG  210, HDL 42, LDL 144 TSH N/A   Review of Systems  Cardiovascular:  Positive for palpitations. Negative for chest pain, dyspnea on exertion, leg swelling and syncope.        Vitals:   11/21/20 1018  BP: (!) 150/81  Pulse: 65  Resp: 16  Temp: 97.8 F (36.6 C)  SpO2: 98%     Body mass index is 22.43  kg/m. Filed Weights   11/21/20 1018  Weight: 170 lb (77.1 kg)     Objective:   Physical Exam Vitals and nursing note reviewed.  Constitutional:      General: He is not in acute distress. Neck:     Vascular: No JVD.  Cardiovascular:     Rate and Rhythm: Normal rate and regular rhythm.     Heart sounds: Normal heart sounds. No murmur heard. Pulmonary:     Effort: Pulmonary effort is normal.     Breath sounds: Normal breath sounds. No wheezing or rales.  Musculoskeletal:     Right lower leg: No edema.     Left lower leg: No edema.           Assessment & Recommendations:   65 y.o. African American male with hypertension, hyperlipidemia, coronary artery disease   CAD: Seen on CT cardiac scoring 02/2020.  No ischemia on stress testing 02/2020.   Continue risk factor modification, including aspirin, Crestor. Lipids now well controlled.   Hypertension: BP elevated today. Recommend home monitoring. If remains elevated, may need increase in lisinopril dose.  Exertional dyspnea: Improved. CAD seen on CT cardiac scoring, without ischemia on stress testing s/o nonobstructive CAD. Continue f/u w/pulmonology re: pleural calcification  Palpitations: One episode with no recurrence. If recurs, recommend cardiac telelmetry  Lipid panel 6 months F/u after that   [name], MD Pager: [phone] Office: [phone]

## 2021-04-10 ENCOUNTER — Telehealth: Payer: Self-pay | Admitting: Pulmonary Disease

## 2021-04-12 ENCOUNTER — Telehealth: Payer: Self-pay | Admitting: Pulmonary Disease

## 2021-04-12 NOTE — Telephone Encounter (Signed)
Cordelia Pen - this CT needs to be scheduled at a [facility] location.

## 2021-04-12 NOTE — Telephone Encounter (Signed)
I called Everardo Pacific at [facility] and verified I only need to fax order to # provided at [facility] and they will call pt to schedule.  Pt notified them she wanted to change locations so I cancelled appt at [facility].  Everardo Pacific states location for [facility] has already been changed.  Order faxed.  Nothing further needed.

## 2021-04-12 NOTE — Telephone Encounter (Signed)
disregard

## 2021-04-18 ENCOUNTER — Inpatient Hospital Stay: Admission: RE | Admit: 2021-04-18 | Payer: Managed Care, Other (non HMO) | Source: Ambulatory Visit

## 2021-04-27 ENCOUNTER — Other Ambulatory Visit: Payer: Self-pay

## 2021-04-27 ENCOUNTER — Ambulatory Visit: Payer: Managed Care, Other (non HMO) | Admitting: Pulmonary Disease

## 2021-04-27 ENCOUNTER — Other Ambulatory Visit: Payer: Self-pay | Admitting: Cardiology

## 2021-04-27 ENCOUNTER — Encounter: Payer: Self-pay | Admitting: Pulmonary Disease

## 2021-04-27 VITALS — BP 142/72 | HR 68 | Temp 98.2°F | Ht 72.0 in | Wt 173.6 lb

## 2021-04-27 DIAGNOSIS — J61 Pneumoconiosis due to asbestos and other mineral fibers: Secondary | ICD-10-CM | POA: Diagnosis not present

## 2021-04-27 DIAGNOSIS — I10 Essential (primary) hypertension: Secondary | ICD-10-CM

## 2021-04-27 NOTE — Progress Notes (Signed)
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  Has history of childhood asthma, history of COVID-11 April 2019.  He was not hospitalized for this Had some mild dyspnea on exertion after feels which has improved but continues to have some residual symptoms.  Denies any cough, wheezing, sputum production  Pets: No pets Occupation: Freight forwarder, now does maintenance work Exposures: No mold, hot tub, Jacuzzi.  No feather pillows or comforters Smoking history: Never smoker Travel history: No significant travel history Relevant family history: No family history of lung disease  Interim history: Is doing well with regard to breathing.  No new complaints  Outpatient Encounter Medications as of 04/27/2021  Medication Sig   amLODipine (NORVASC) 5 MG tablet TAKE 1 TABLET(5 MG) BY MOUTH DAILY   aspirin EC 81 MG tablet Take 1 tablet (81 mg total) by mouth daily. Swallow whole.   hydrochlorothiazide (HYDRODIURIL) 25 MG tablet TAKE 1 TABLET(25 MG) BY MOUTH DAILY   nitroGLYCERIN (NITROSTAT) 0.4 MG SL tablet SMARTSIG:1 Tablet(s) Sublingual PRN   rosuvastatin (CRESTOR) 20 MG tablet Take 1 tablet (20 mg total) by mouth daily.   lisinopril (ZESTRIL) 10 MG tablet Take 10 mg by mouth daily. (Patient not taking: Reported on 04/27/2021)   No facility-administered encounter medications on file as of 04/27/2021.   Physical Exam: Blood pressure (!) 142/72, pulse 68, temperature 98.2 F (36.8 C), temperature source Oral, height 6' (1.829 m), weight 173 lb 9.6 oz (78.7 kg), SpO2 99 %. Gen:      No acute distress HEENT:  EOMI, sclera anicteric Neck:     No masses; no  thyromegaly Lungs:    Clear to auscultation bilaterally; normal respiratory effort CV:         Regular rate and rhythm; no murmurs Abd:      + bowel sounds; soft, non-tender; no palpable masses, no distension Ext:    No edema; adequate peripheral perfusion Skin:      Warm and dry; no rash Neuro: alert and oriented x 3 Psych: normal mood and affect   Data Reviewed: Imaging: CT coronaries [facility] 03/07/2020- by report bilateral pleural calcifications suggestive of asbestos exposure.  High-resolution CT 08/29/2020-pleural calcifications and thickening, UIP fibrosis.  I have reviewed the images personally.  PFTs: 10/17/2020 FVC 2.74 [65%], FEV1 2.42 [74%], F/F 88, TLC 4.50 [61%], DLCO 20.72 [73%] Moderate restriction with minimal diffusion defect  Labs:  Assessment:  Asbestosis His CT scan shows pleural calcification and scarring at the base suggestive of asbestos exposure though he denies any known known exposure History notable for childhood asthma which he grew out of and COVID in 2020  Reviewed PFTs with restriction and diffusion impairment No treatment needed at present unless there is progression of fibrosis Follow-up with high-res CT and PFTs in 6 months  Plan/Recommendations: PFTs and High-res CT in 6 months  Marshell Garfinkel MD [facility] 04/27/2021, 4:15 PM  CC: Long, Geoffrey Reaper, PA-C

## 2021-04-27 NOTE — Patient Instructions (Signed)
I am glad you are doing well with your breathing We will get a high-res CT and PFTs in 6 months Follow-up in clinic after test

## 2021-05-28 ENCOUNTER — Other Ambulatory Visit: Payer: Self-pay | Admitting: Cardiology

## 2021-05-28 DIAGNOSIS — I208 Other forms of angina pectoris: Secondary | ICD-10-CM

## 2021-05-28 DIAGNOSIS — I2510 Atherosclerotic heart disease of native coronary artery without angina pectoris: Secondary | ICD-10-CM

## 2021-06-05 ENCOUNTER — Other Ambulatory Visit: Payer: Self-pay

## 2021-06-05 ENCOUNTER — Ambulatory Visit: Payer: Managed Care, Other (non HMO) | Admitting: Cardiology

## 2021-06-05 ENCOUNTER — Encounter: Payer: Self-pay | Admitting: Cardiology

## 2021-06-05 VITALS — BP 133/84 | HR 72 | Temp 97.0°F | Ht 72.0 in | Wt 173.0 lb

## 2021-06-05 DIAGNOSIS — I10 Essential (primary) hypertension: Secondary | ICD-10-CM

## 2021-06-05 DIAGNOSIS — E782 Mixed hyperlipidemia: Secondary | ICD-10-CM

## 2021-06-05 DIAGNOSIS — I25118 Atherosclerotic heart disease of native coronary artery with other forms of angina pectoris: Secondary | ICD-10-CM

## 2021-06-05 MED ORDER — ROSUVASTATIN CALCIUM 20 MG PO TABS: 20.0000 mg | ORAL_TABLET | Freq: Every day | ORAL | 4 refills | Status: DC

## 2021-06-05 MED ORDER — METOPROLOL TARTRATE 25 MG PO TABS: 25.0000 mg | ORAL_TABLET | Freq: Two times a day (BID) | ORAL | 3 refills | Status: DC

## 2021-06-05 NOTE — Patient Instructions (Signed)
Your cardiac CT will be scheduled at the locations below:   [facility]  [address]  [place], [place] [address]  [phone]    If scheduled at [facility], please arrive at the [facility] main entrance of [facility] 30-45 minutes prior to test start time.  Proceed to the [facility] Radiology Department (first floor) to check-in and test prep.   Please follow these instructions carefully (unless otherwise directed):    Hold all erectile dysfunction medications at least 3 days (72 hrs) prior to test.   On the Night Before the Test:   Be sure to Drink plenty of water.   Do not consume any caffeinated/decaffeinated beverages or chocolate 12 hours prior to your test.   Do not take any antihistamines 12 hours prior to your test.   If the patient has contrast allergy:  Patient will need a prescription for Prednisone and very clear instructions (as follows):  1. Prednisone 50 mg - take 13 hours prior to test  2. Take another Prednisone 50 mg 7 hours prior to test  3. Take another Prednisone 50 mg 1 hour prior to test  4. Take Benadryl 50 mg 1 hour prior to test   Patient must complete all four doses of above prophylactic medications.   Patient will need a ride after test due to Benadryl.   On the Day of the Test:   Drink plenty of water. Do not drink any water within one hour of the test.   Do not eat any food 4 hours prior to the test.   You may take your regular medications prior to the test.    - Metoprolol tartarate 25 mg twice daily starting two days before CT scan 50 mg 2 hours before CT scan You may stop it after the CT scan, unless specified otherwise by me.           After the Test:   Drink plenty of water.   After receiving IV contrast, you may experience a mild flushed feeling. This is normal.   On occasion, you may experience a mild rash up to 24 hours after the test. This is not dangerous. If this occurs, you can  take Benadryl 25 mg and increase your fluid intake.   If you experience trouble breathing, this can be serious. If it is severe call 911 IMMEDIATELY. If it is mild, please call our office.   If you take any of these medications: Glipizide/Metformin, Avandament, Glucavance, please do not take 48 hours after completing test unless otherwise instructed.     Please contact the cardiac imaging nurse navigator should you have any questions/concerns  Rockwell Alexandria, RN Navigator Cardiac Imaging  [facility]  [phone] Office  [phone] Cell

## 2021-06-05 NOTE — Progress Notes (Signed)
Subjective:   Geoffrey Williams, male    DOB: May 05, 1955, 66 y.o.   MRN: [id number]   Chief Complaint  Patient presents with   Coronary Artery Disease   Hypertension   Hyperlipidemia     HPI  66 y.o. African American male with hypertension, hyperlipidemia, coronary artery disease   Patient was seen by pulmonologist Dr. Vaughan Browner in 04/2021, was found to have pleural calcification and scarring suggestive of asbestos exposure.  PFTs showed restriction and diffusion impairment.  No treatment was felt necessary unless there was progression of fibrosis.  Repeat CT scan and PFTs were recommended in 6 months.  Patient is here for follow-up with me today.  He denies any chest pain.  However, does notice exertional dyspnea, especially when walking in the cold.  This improves after resting and drinking some water.  He walks up to 8000 steps a day at work.  He ran out of his Crestor and has not been taking it for last several days.  He is also not on lisinopril.  Blood pressure is higher than his previous readings in the past.   Current Outpatient Medications on File Prior to Visit  Medication Sig Dispense Refill   amLODipine (NORVASC) 5 MG tablet TAKE 1 TABLET(5 MG) BY MOUTH DAILY 60 tablet 2   ASPIRIN LOW DOSE 81 MG EC tablet TAKE 1 TABLET(81 MG) BY MOUTH DAILY. SWALLOW WHOLE 90 tablet 3   hydrochlorothiazide (HYDRODIURIL) 25 MG tablet TAKE 1 TABLET(25 MG) BY MOUTH DAILY 60 tablet 3   lisinopril (ZESTRIL) 10 MG tablet Take 10 mg by mouth daily. (Patient not taking: Reported on 04/27/2021)     nitroGLYCERIN (NITROSTAT) 0.4 MG SL tablet SMARTSIG:1 Tablet(s) Sublingual PRN     rosuvastatin (CRESTOR) 20 MG tablet Take 1 tablet (20 mg total) by mouth daily. 60 tablet 2   No current facility-administered medications on file prior to visit.    Cardiovascular and other pertinent studies:  EKG 06/05/2021: Sinus rhythm 74 bpm Normal EKG  Exercise Sestamibi Stress Test 03/21/2020: Equivocal ECG  stress. The patient exercised for 7 minutes and 0 seconds of a Bruce protocol, achieving approximately 8.15 METs. Stress terminated due to fatigue and dyspnea.  Peak BP was 252/82 mmHg, which is a hypertensive response to exercise. Peak EKG/ECG demonstrated normal sinus rhythm. Non-specific ST-T abnormality. During exercise the peak ECG revealed premature ventricular contractions, occasional ventricular couplets and 2 ventricular triplets. No ST-T changes of ischemia. Myocardial perfusion is normal. Overall LV systolic function is normal without regional wall motion abnormalities. Stress LV EF: 63%.  No previous exam available for comparison. Low risk study. Clinical correlation recommended.   Echocardiogram 03/14/2020:  Left ventricle cavity is normal in size and wall thickness. Normal global  wall motion. Normal LV systolic function with EF 68%. Normal diastolic  filling pattern.  Structurally normal trileaflet aortic valve.  Trace aortic regurgitation.  Mild tricuspid regurgitation.  No evidence of pulmonary hypertension.  No significant change compared to previous study in 2018.   CT Cardiac scoring 03/07/2020: Total score: 84.  LM: 33.  LAD: 36.  Cx: 15.  RCA: 0  PDA: 0   Cardiac anatomy:  Normal heart size, no evidence of pericardial effusion  Mediastinum: No adenopathy  Visible abdomen: No significant abnormality  Visible lung fields: Bilateral pleural calcifications suggestive of asbestos exposure.   EKG 03/07/2020: Sinus rhythm 64 bpm Normal EKG  Chest x-ray 02/18/2020: Normal heart size.  Blunting of right costophrenic angle consistent with either  pleural scarring or pleural effusion.  Pleural plaque posteriorly which would be supportive of chronic pleural scarring. No definite evidence of CHF.    Recent labs: 04/19/2020: Chol 120 TG 131, HDL 35, LDL 62  02/16/2020: Glucose 154, BUN/Cr 11/1.17. EGFR 76. Na/K 139/4.5. Rest of the CMP normal H/H 15.9/46.5. MCV  88.7. Platelets 232 HbA1C N/A Chol 221, TG 210, HDL 42, LDL 144 TSH N/A   Review of Systems  Cardiovascular:  Positive for palpitations. Negative for chest pain, dyspnea on exertion, leg swelling and syncope.        Vitals:   06/05/21 1021 06/05/21 1022  BP: 140/84 133/84  Pulse: 78 72  Temp: (!) 97 F (36.1 C)   SpO2: 99% 98%     Body mass index is 23.46 kg/m. Filed Weights   06/05/21 1021  Weight: 173 lb (78.5 kg)     Objective:   Physical Exam Vitals and nursing note reviewed.  Constitutional:      General: He is not in acute distress. Neck:     Vascular: No JVD.  Cardiovascular:     Rate and Rhythm: Normal rate and regular rhythm.     Heart sounds: Normal heart sounds. No murmur heard. Pulmonary:     Effort: Pulmonary effort is normal.     Breath sounds: Normal breath sounds. No wheezing or rales.  Musculoskeletal:     Right lower leg: No edema.     Left lower leg: No edema.           Assessment & Recommendations:   66 y.o. African American male with hypertension, hyperlipidemia, coronary artery disease   CAD: Seen on CT cardiac scoring 02/2020.  Calcium score of 84, including 33 in left main. Symptoms of exertional dyspnea, cannot exclude angina equivalent. He has previously had no ischemia on stress testing 02/2020.  However, given his dyspnea symptoms and presence of calcium in left main, will obtain coronary CT angiography to rule out obstructive disease. Started metoprolol tartrate 25 mg twice daily. Continue aspirin, amlodipine. Resumed Crestor 20 mg daily.  Repeat lipid panel in 3 months.  If CT angiogram shows no obstructive CAD, consider follow-up with Dr. Vaughan Browner.  Hypertension: Currently not on lisinopril.  I started him on metoprolol.  Along with amlodipine, I am hopeful this will control his blood pressure well.    F/u after coronary CT angiogram   [name], MD Pager: [phone] Office: [phone]

## 2021-06-06 ENCOUNTER — Other Ambulatory Visit: Payer: Self-pay | Admitting: Cardiology

## 2021-06-06 DIAGNOSIS — I10 Essential (primary) hypertension: Secondary | ICD-10-CM

## 2021-07-04 LAB — LIPID PANEL
Chol/HDL Ratio: 3.8 ratio (ref 0.0–5.0)
Cholesterol, Total: 152 mg/dL (ref 100–199)
HDL: 40 mg/dL (ref >39)
LDL Chol Calc (NIH): 69 mg/dL (ref 0–99)
Triglycerides: 264 mg/dL — ABNORMAL HIGH (ref 0–149)
VLDL Cholesterol Cal: 43 mg/dL — ABNORMAL HIGH (ref 5–40)

## 2021-07-04 LAB — BASIC METABOLIC PANEL
BUN/Creatinine Ratio: 13 (ref 10–24)
BUN: 15 mg/dL (ref 8–27)
CO2: 27 mmol/L (ref 20–29)
Calcium: 9.6 mg/dL (ref 8.6–10.2)
Chloride: 99 mmol/L (ref 96–106)
Creatinine, Ser: 1.16 mg/dL (ref 0.76–1.27)
Glucose: 302 mg/dL — ABNORMAL HIGH (ref 70–99)
Potassium: 3.7 mmol/L (ref 3.5–5.2)
Sodium: 142 mmol/L (ref 134–144)
eGFR: 70 mL/min/{1.73_m2} (ref >59)

## 2021-07-07 ENCOUNTER — Telehealth (HOSPITAL_COMMUNITY): Payer: Self-pay | Admitting: Emergency Medicine

## 2021-07-07 NOTE — Telephone Encounter (Signed)
Attempted to call patient regarding upcoming cardiac CT appointment. °Left message on voicemail with name and callback number °Jaedynn Bohlken RN Navigator Cardiac Imaging °[facility] °[phone] Office °[phone] Cell ° °

## 2021-07-10 ENCOUNTER — Ambulatory Visit: Payer: Managed Care, Other (non HMO) | Admitting: Cardiology

## 2021-07-10 ENCOUNTER — Other Ambulatory Visit: Payer: Self-pay

## 2021-07-10 ENCOUNTER — Ambulatory Visit (HOSPITAL_COMMUNITY)
Admission: RE | Admit: 2021-07-10 | Discharge: 2021-07-10 | Disposition: A | Discharge status: Home / Self Care | Payer: Managed Care, Other (non HMO) | Source: Ambulatory Visit | Attending: Cardiology | Admitting: Cardiology

## 2021-07-10 DIAGNOSIS — I25118 Atherosclerotic heart disease of native coronary artery with other forms of angina pectoris: Secondary | ICD-10-CM | POA: Insufficient documentation

## 2021-07-10 MED ORDER — NITROGLYCERIN 0.4 MG SL SUBL
SUBLINGUAL_TABLET | SUBLINGUAL | Status: AC
  Filled 2021-07-10: qty 2

## 2021-07-10 MED ORDER — IOHEXOL 350 MG/ML SOLN
100.0000 mL | Freq: Once | INTRAVENOUS | Status: AC | PRN
  Administered 2021-07-10: 100 mL via INTRAVENOUS

## 2021-07-10 MED ORDER — NITROGLYCERIN 0.4 MG SL SUBL
0.8000 mg | SUBLINGUAL_TABLET | Freq: Once | SUBLINGUAL | Status: AC
  Administered 2021-07-10: 0.8 mg via SUBLINGUAL

## 2021-07-14 NOTE — Progress Notes (Signed)
? ? ? ?Subjective:  ? ?Geoffrey Williams, male    DOB: 1955-07-01, 66 y.o.   MRN: [id number] ? ? ?Chief Complaint  ?Patient presents with  ? Results  ? Follow-up  ? ? ? ?HPI ? ?66 y.o. African American male with hypertension, hyperlipidemia, coronary artery disease  ? ?Reviewed recent test results with the patient, details below.  Patient has stable, unchanged, mild exertional dyspnea.  Again, denies chest pain. ? ? ?Current Outpatient Medications:  ?  amLODipine (NORVASC) 5 MG tablet, TAKE 1 TABLET(5 MG) BY MOUTH DAILY, Disp: 60 tablet, Rfl: 2 ?  ASPIRIN LOW DOSE 81 MG EC tablet, TAKE 1 TABLET(81 MG) BY MOUTH DAILY. SWALLOW WHOLE, Disp: 90 tablet, Rfl: 3 ?  hydrochlorothiazide (HYDRODIURIL) 25 MG tablet, TAKE 1 TABLET(25 MG) BY MOUTH DAILY, Disp: 90 tablet, Rfl: 1 ?  metoprolol tartrate (LOPRESSOR) 25 MG tablet, Take 1 tablet (25 mg total) by mouth 2 (two) times daily., Disp: 60 tablet, Rfl: 3 ?  nitroGLYCERIN (NITROSTAT) 0.4 MG SL tablet, SMARTSIG:1 Tablet(s) Sublingual PRN, Disp: , Rfl:  ?  rosuvastatin (CRESTOR) 20 MG tablet, Take 1 tablet (20 mg total) by mouth daily., Disp: 90 tablet, Rfl: 4 ? ?Cardiovascular and other pertinent studies: ? ?CCTA 07/10/2021: ?1. Total coronary calcium score of 88.6. This was 76th percentile ?for age and sex matched control. ?2. Normal coronary origin with co-dominance. ?3. CAD-RADS = 2 Mild non-obstructive CAD. ?Left Main: Patent with minimal luminal irregularities. ?LAD: Minimal stenosis (0-24%) at ostial LAD due to mixed plaque. ?Mild stenosis (25-49%) at proximal to mid LAD due to mixed plaque. ?Mid to distal and apical LAD are patent. ?LCX: Patent with no evidence of obstructive plaque or stenosis. ?RCA: Patent. ? ?EKG 06/05/2021: ?Sinus rhythm 74 bpm ?Normal EKG ? ?Exercise Sestamibi Stress Test 03/21/2020: ?Equivocal ECG stress. The patient exercised for 7 minutes and 0 seconds of a Bruce protocol, achieving approximately 8.15 METs. Stress terminated due to fatigue and dyspnea.   ?Peak BP was 252/82 mmHg, which is a hypertensive response to exercise. Peak EKG/ECG demonstrated normal sinus rhythm. Non-specific ST-T abnormality. During exercise the peak ECG revealed premature ventricular contractions, occasional ventricular couplets and 2 ventricular triplets. No ST-T changes of ischemia. ?Myocardial perfusion is normal. Overall LV systolic function is normal without regional wall motion abnormalities. Stress LV EF: 63%.  ?No previous exam available for comparison. Low risk study. Clinical correlation recommended.  ? ?Echocardiogram 03/14/2020:  ?Left ventricle cavity is normal in size and wall thickness. Normal global  ?wall motion. Normal LV systolic function with EF 68%. Normal diastolic  ?filling pattern.  ?Structurally normal trileaflet aortic valve.  Trace aortic regurgitation.  ?Mild tricuspid regurgitation.  ?No evidence of pulmonary hypertension.  ?No significant change compared to previous study in 2018.  ? ?CT Cardiac scoring 03/07/2020: ?Total score: 84.  ?LM: 33.  ?LAD: 24.  ?Cx: 15.  ?RCA: 0  ?PDA: 0  ? 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 ? ? ?EKG 03/07/2020: ?Sinus rhythm 64 bpm ?Normal EKG ? ?Chest x-ray 02/18/2020: ?Normal heart size.  Blunting of right costophrenic angle consistent with either pleural scarring or pleural effusion.  Pleural plaque posteriorly which would be supportive of chronic pleural scarring. ?No definite evidence of CHF. ? ? ? ?Recent labs: ?07/03/2021: ?Chol 152 TG 264, HDL 40, LDL 69 ? ?04/19/2020: ?Chol 120 TG 131,  HDL 35, LDL 62 ? ?02/16/2020: ?Glucose 154, BUN/Cr 11/1.17. EGFR 76. Na/K 139/4.5. Rest of the CMP normal ?H/H 15.9/46.5. MCV 88.7. Platelets 232 ?HbA1C N/A ?Chol 221, TG 210, HDL 42, LDL 144 ?TSH N/A ? ? ?Review of Systems  ?Cardiovascular:   Positive for palpitations. Negative for chest pain, dyspnea on exertion, leg swelling and syncope.  ? ?   ? ? ?Vitals:  ? 07/17/21 1318  ?BP: 123/82  ?Pulse: 62  ?Resp: 16  ?Temp: (!) 97 ?F (36.1 ?C)  ?SpO2: 97%  ? ? ? ? ?Body mass index is 23.33 kg/m?. ?Filed Weights  ? 07/17/21 1318  ?Weight: 172 lb (78 kg)  ? ? ? ? ?Objective:  ? Physical Exam ?Vitals and nursing note reviewed.  ?Constitutional:   ?   General: He is not in acute distress. ?Neck:  ?   Vascular: No JVD.  ?Cardiovascular:  ?   Rate and Rhythm: Normal rate and regular rhythm.  ?   Heart sounds: Normal heart sounds. No murmur heard. ?Pulmonary:  ?   Effort: Pulmonary effort is normal.  ?   Breath sounds: Normal breath sounds. No wheezing or rales.  ?Musculoskeletal:  ?   Right lower leg: No edema.  ?   Left lower leg: No edema.  ? ? ? ?  ICD-10-CM   ?1. Coronary artery disease involving native coronary artery of native heart without angina pectoris  I25.10 Lipid panel  ?  Lipid panel  ?  ?2. Mixed hyperlipidemia  E78.2   ?  ?3. Essential hypertension  I10   ?  ? ? ? ?   ? ?Assessment & Recommendations:  ? ?66 y.o. African American male with hypertension, hyperlipidemia, coronary artery disease  ? ?CAD: ?Seen on CT cardiac scoring 02/2020.  Calcium score of 84, including 33 in left main. ?No obstructive disease noted on coronary CT angiogram 06/2021. ?Continue aggressive risk factor modification, including Crestor 20 mg daily. ?While LDL is well controlled, triglyceride is elevated.  Discussed low carbohydrate, low saturated fat diet.  Repeat fasting lipid panel in 6 months. ? ?Exertional dyspnea is unlikely to be manifestation of nonobstructive coronary artery disease. ?Continue follow-up with Dr. Vaughan Browner. ? ?Hypertension: ?Well-controlled ? ?F/u in 6 months  ? ? ? ?[name], MD ?Pager: [phone] ?Office: [phone] ? ?

## 2021-07-17 ENCOUNTER — Ambulatory Visit: Payer: Managed Care, Other (non HMO) | Admitting: Cardiology

## 2021-07-17 ENCOUNTER — Other Ambulatory Visit: Payer: Self-pay

## 2021-07-17 ENCOUNTER — Encounter: Payer: Self-pay | Admitting: Cardiology

## 2021-07-17 VITALS — BP 123/82 | HR 62 | Temp 97.0°F | Resp 16 | Ht 72.0 in | Wt 172.0 lb

## 2021-07-17 DIAGNOSIS — I25118 Atherosclerotic heart disease of native coronary artery with other forms of angina pectoris: Secondary | ICD-10-CM

## 2021-07-17 DIAGNOSIS — I2510 Atherosclerotic heart disease of native coronary artery without angina pectoris: Secondary | ICD-10-CM

## 2021-07-17 DIAGNOSIS — I10 Essential (primary) hypertension: Secondary | ICD-10-CM

## 2021-07-17 DIAGNOSIS — E782 Mixed hyperlipidemia: Secondary | ICD-10-CM

## 2021-09-11 ENCOUNTER — Other Ambulatory Visit: Payer: Self-pay | Admitting: Cardiology

## 2021-09-11 DIAGNOSIS — I10 Essential (primary) hypertension: Secondary | ICD-10-CM

## 2021-09-11 DIAGNOSIS — I25118 Atherosclerotic heart disease of native coronary artery with other forms of angina pectoris: Secondary | ICD-10-CM

## 2021-10-10 ENCOUNTER — Emergency Department (HOSPITAL_COMMUNITY)
Admission: EM | Admit: 2021-10-10 | Discharge: 2021-10-10 | Disposition: A | Discharge status: Home / Self Care | Payer: Managed Care, Other (non HMO) | Attending: Emergency Medicine | Admitting: Emergency Medicine

## 2021-10-10 ENCOUNTER — Emergency Department (HOSPITAL_COMMUNITY): Payer: Managed Care, Other (non HMO)

## 2021-10-10 DIAGNOSIS — J45909 Unspecified asthma, uncomplicated: Secondary | ICD-10-CM | POA: Diagnosis not present

## 2021-10-10 DIAGNOSIS — S0101XA Laceration without foreign body of scalp, initial encounter: Secondary | ICD-10-CM | POA: Diagnosis not present

## 2021-10-10 DIAGNOSIS — E119 Type 2 diabetes mellitus without complications: Secondary | ICD-10-CM | POA: Diagnosis not present

## 2021-10-10 DIAGNOSIS — Z7984 Long term (current) use of oral hypoglycemic drugs: Secondary | ICD-10-CM | POA: Diagnosis not present

## 2021-10-10 DIAGNOSIS — S0990XA Unspecified injury of head, initial encounter: Secondary | ICD-10-CM | POA: Diagnosis present

## 2021-10-10 DIAGNOSIS — Z23 Encounter for immunization: Secondary | ICD-10-CM | POA: Insufficient documentation

## 2021-10-10 DIAGNOSIS — Z7982 Long term (current) use of aspirin: Secondary | ICD-10-CM | POA: Diagnosis not present

## 2021-10-10 DIAGNOSIS — M542 Cervicalgia: Secondary | ICD-10-CM | POA: Insufficient documentation

## 2021-10-10 DIAGNOSIS — Z79899 Other long term (current) drug therapy: Secondary | ICD-10-CM | POA: Insufficient documentation

## 2021-10-10 DIAGNOSIS — Y92410 Unspecified street and highway as the place of occurrence of the external cause: Secondary | ICD-10-CM | POA: Diagnosis not present

## 2021-10-10 DIAGNOSIS — I10 Essential (primary) hypertension: Secondary | ICD-10-CM | POA: Insufficient documentation

## 2021-10-10 DIAGNOSIS — R109 Unspecified abdominal pain: Secondary | ICD-10-CM | POA: Insufficient documentation

## 2021-10-10 LAB — BASIC METABOLIC PANEL
Anion gap: 12 (ref 5–15)
BUN: 14 mg/dL (ref 8–23)
CO2: 28 mmol/L (ref 22–32)
Calcium: 9.4 mg/dL (ref 8.9–10.3)
Chloride: 96 mmol/L — ABNORMAL LOW (ref 98–111)
Creatinine, Ser: 1.15 mg/dL (ref 0.61–1.24)
GFR, Estimated: >60 mL/min (ref >60)
Glucose, Bld: 423 mg/dL — ABNORMAL HIGH (ref 70–99)
Potassium: 3.6 mmol/L (ref 3.5–5.1)
Sodium: 136 mmol/L (ref 135–145)

## 2021-10-10 LAB — CBC
HCT: 43.4 % (ref 39.0–52.0)
Hemoglobin: 15.8 g/dL (ref 13.0–17.0)
MCH: 31.2 pg (ref 26.0–34.0)
MCHC: 36.4 g/dL — ABNORMAL HIGH (ref 30.0–36.0)
MCV: 85.6 fL (ref 80.0–100.0)
Platelets: 185 10*3/uL (ref 150–400)
RBC: 5.07 MIL/uL (ref 4.22–5.81)
RDW: 13.4 % (ref 11.5–15.5)
WBC: 10.8 10*3/uL — ABNORMAL HIGH (ref 4.0–10.5)
nRBC: 0 % (ref 0.0–0.2)

## 2021-10-10 LAB — HEMOGLOBIN A1C
Hgb A1c MFr Bld: 13 % — ABNORMAL HIGH (ref 4.8–5.6)
Mean Plasma Glucose: 326.4 mg/dL

## 2021-10-10 LAB — CBG MONITORING, ED: Glucose-Capillary: 379 mg/dL — ABNORMAL HIGH (ref 70–99)

## 2021-10-10 MED ORDER — INSULIN ASPART 100 UNIT/ML IJ SOLN: 6.0000 [IU] | Freq: Once | INTRAMUSCULAR | Status: DC

## 2021-10-10 MED ORDER — FENTANYL CITRATE PF 50 MCG/ML IJ SOSY
50.0000 ug | PREFILLED_SYRINGE | Freq: Once | INTRAMUSCULAR | Status: AC
  Administered 2021-10-10: 50 ug via INTRAVENOUS
  Filled 2021-10-10: qty 1

## 2021-10-10 MED ORDER — ONDANSETRON HCL 4 MG/2ML IJ SOLN
4.0000 mg | Freq: Once | INTRAMUSCULAR | Status: AC
  Administered 2021-10-10: 4 mg via INTRAVENOUS
  Filled 2021-10-10: qty 2

## 2021-10-10 MED ORDER — METFORMIN HCL 500 MG PO TABS: 500.0000 mg | ORAL_TABLET | Freq: Two times a day (BID) | ORAL | 0 refills | Status: DC

## 2021-10-10 MED ORDER — LIDOCAINE HCL (PF) 1 % IJ SOLN
5.0000 mL | Freq: Once | INTRAMUSCULAR | Status: AC
  Administered 2021-10-10: 5 mL
  Filled 2021-10-10: qty 5

## 2021-10-10 MED ORDER — TETANUS-DIPHTH-ACELL PERTUSSIS 5-2.5-18.5 LF-MCG/0.5 IM SUSY
0.5000 mL | PREFILLED_SYRINGE | Freq: Once | INTRAMUSCULAR | Status: AC
  Administered 2021-10-10: 0.5 mL via INTRAMUSCULAR
  Filled 2021-10-10: qty 0.5

## 2021-10-10 MED ORDER — IOHEXOL 300 MG/ML  SOLN
100.0000 mL | Freq: Once | INTRAMUSCULAR | Status: AC | PRN
  Administered 2021-10-10: 100 mL via INTRAVENOUS

## 2021-10-10 NOTE — Discharge Instructions (Addendum)
Have the stitches taken out in about 7 days.  Start taking the metformin on Thursday.  Follow-up with a primary care doctor
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
## 2021-10-23 ENCOUNTER — Ambulatory Visit
Admission: RE | Admit: 2021-10-23 | Discharge: 2021-10-23 | Disposition: A | Discharge status: Home / Self Care | Payer: Managed Care, Other (non HMO) | Source: Ambulatory Visit | Attending: Pulmonary Disease | Admitting: Pulmonary Disease

## 2021-10-23 DIAGNOSIS — J61 Pneumoconiosis due to asbestos and other mineral fibers: Secondary | ICD-10-CM

## 2021-11-27 ENCOUNTER — Other Ambulatory Visit: Payer: Self-pay | Admitting: Cardiology

## 2021-11-27 DIAGNOSIS — I10 Essential (primary) hypertension: Secondary | ICD-10-CM

## 2021-12-06 IMAGING — CT CT CHEST HIGH RESOLUTION W/O CM
2 of 7 series · 14 of 36 positions shown, 17 images · non-contrast
Comparison: None.

CLINICAL DATA: Dyspnea on exertion status post COVID pneumonia in
late 7070. Childhood history of asthma.

EXAM:
CT CHEST WITHOUT CONTRAST
TECHNIQUE: Multidetector CT imaging of the chest was performed following the
standard protocol without intravenous contrast. High resolution
imaging of the lungs, as well as inspiratory and expiratory imaging,
was performed.

[Series 4: high resolution · axial · 0.69mm/px · z∈[-332,-68]mm · 11 of 318 slices shown, 14 images]
[im 27/318  mediastinal]
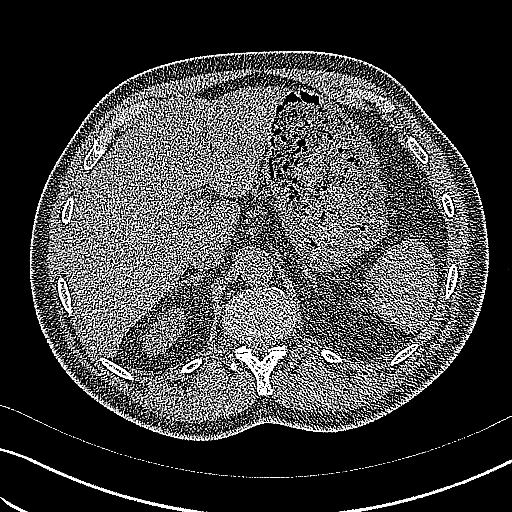
[im 27/318  lung]
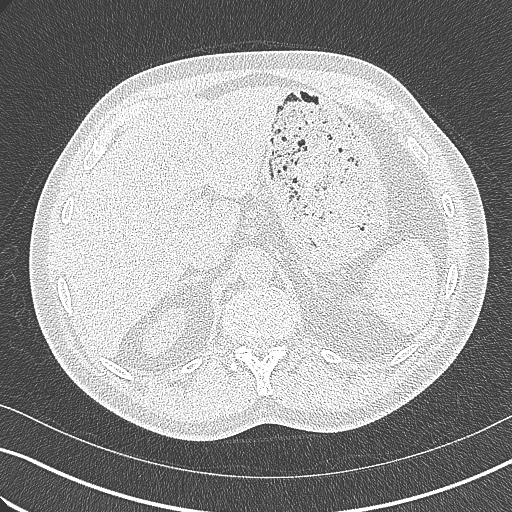
[im 53/318  lung]
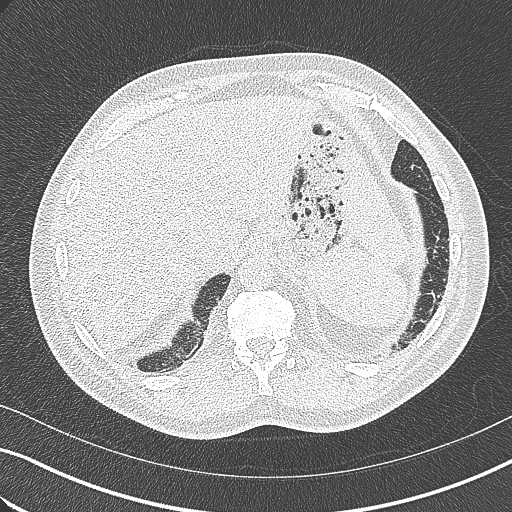
[im 80/318  lung]
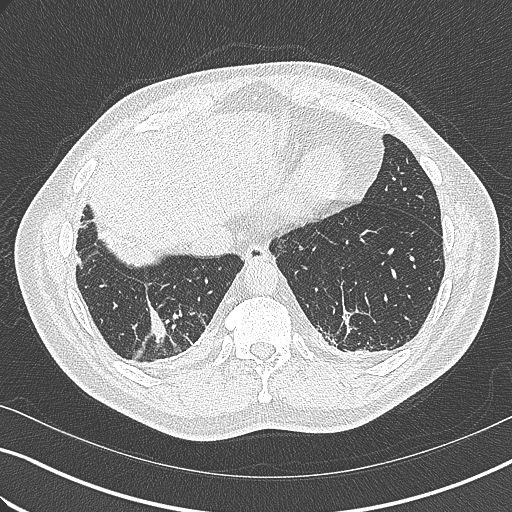
[im 106/318  lung]
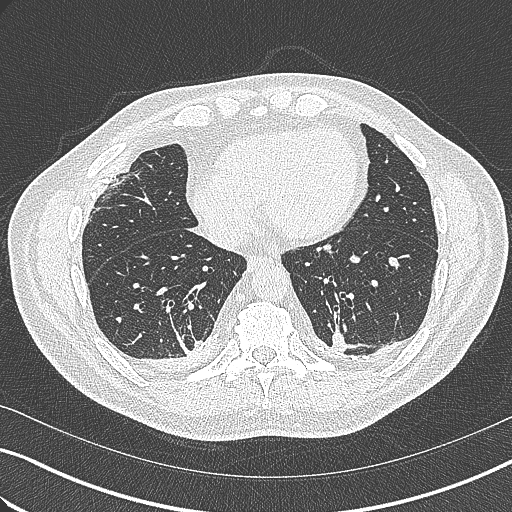
[im 133/318  mediastinal]
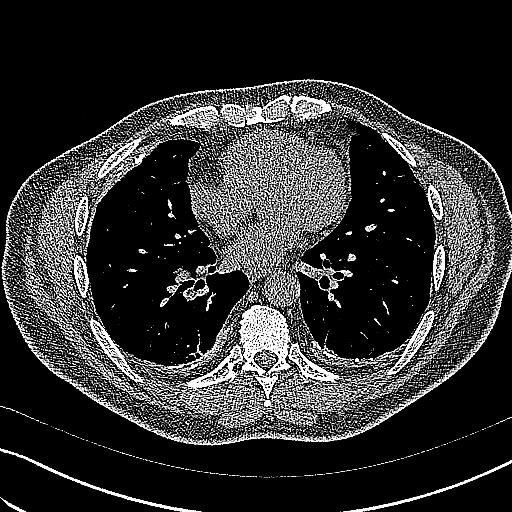
[im 133/318  lung]
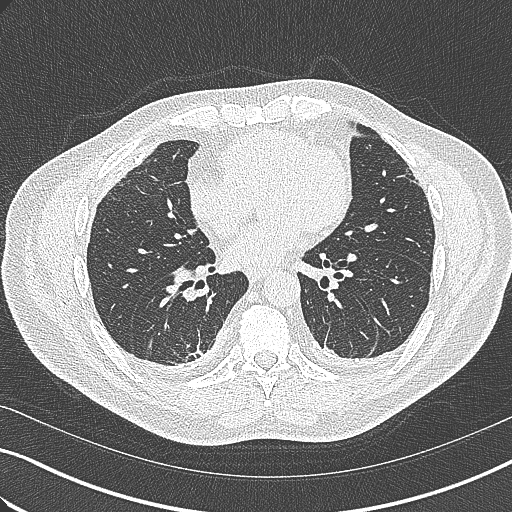
[im 159/318  lung]
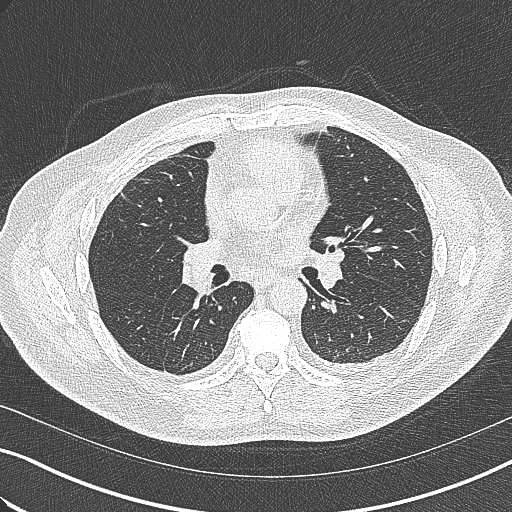
[im 185/318  lung]
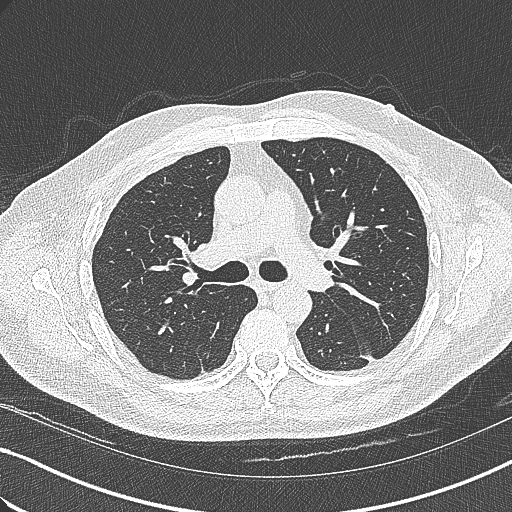
[im 212/318  lung]
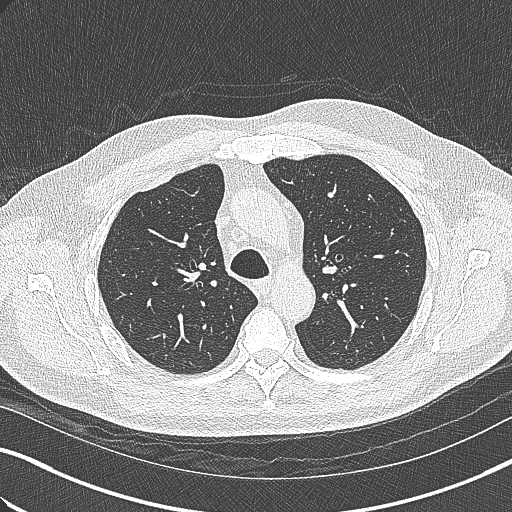
[im 238/318  mediastinal]
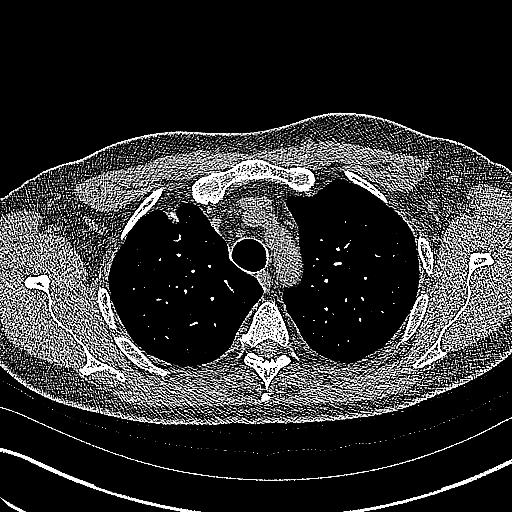
[im 238/318  lung]
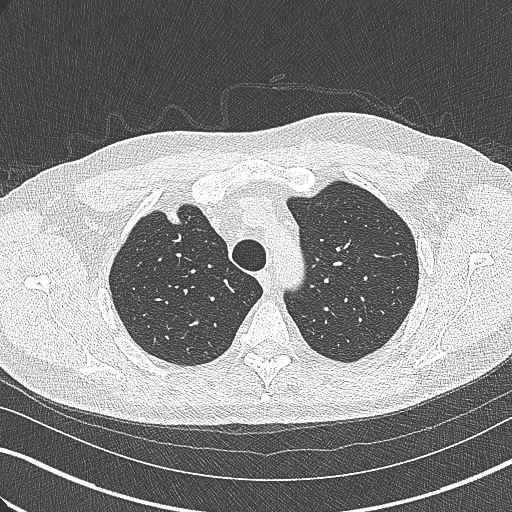
[im 265/318  lung]
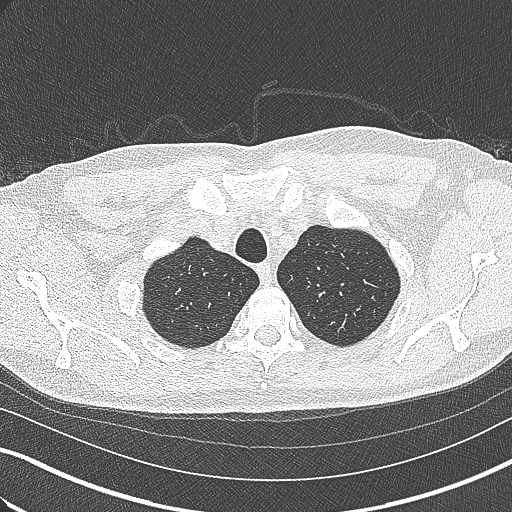
[im 291/318  lung]
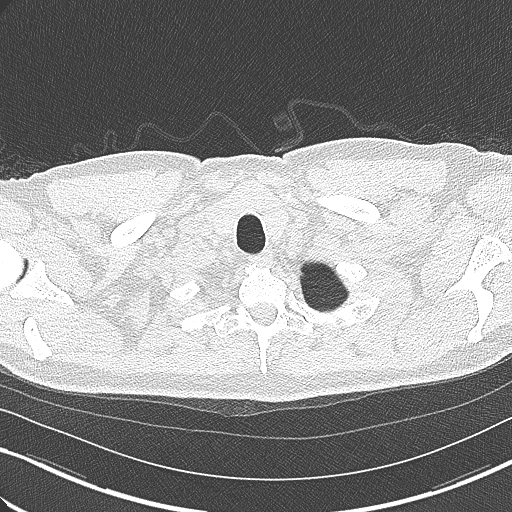

[Series 6: coronal · coronal · 0.59mm/px · 3 of 114 slices shown]
[im 23/114  lung]
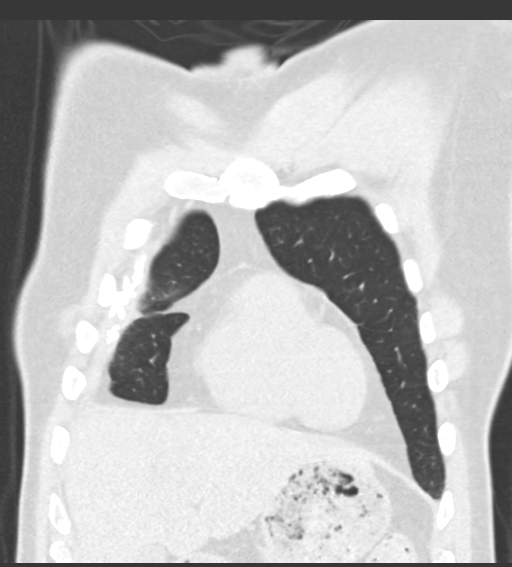
[im 46/114  lung]
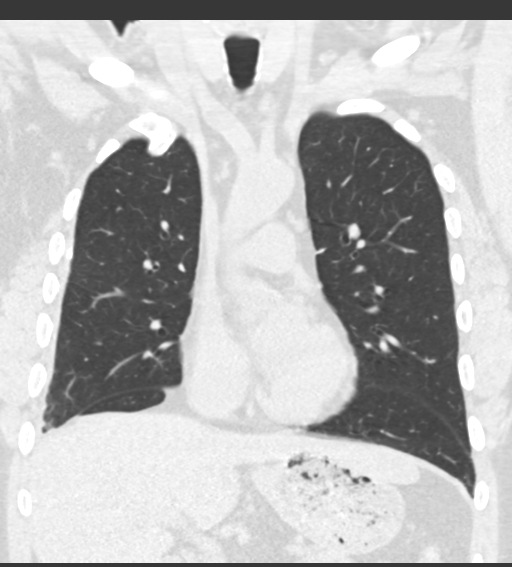
[im 68/114  lung]
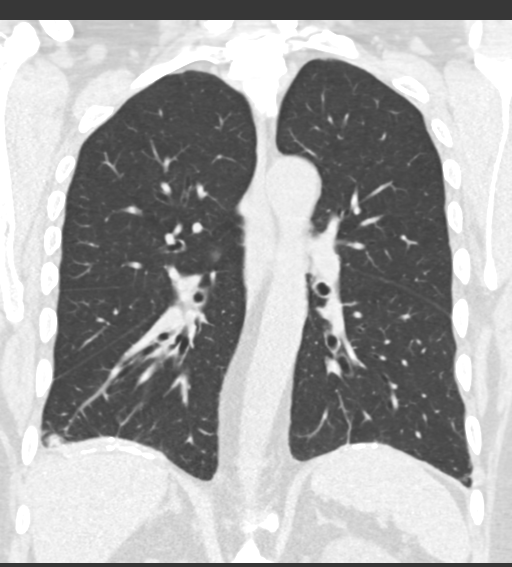

[14 of 36 positions shown; findings below may reference images not displayed]

FINDINGS: Cardiovascular: Normal heart size. No significant pericardial
effusion/thickening. Left anterior descending and left circumflex
coronary atherosclerosis. Great vessels are normal in course and
caliber.

Mediastinum/Nodes: No discrete thyroid nodules. Unremarkable
esophagus. No pathologically enlarged axillary, mediastinal or hilar
lymph nodes, noting limited sensitivity for the detection of hilar
adenopathy on this noncontrast study.

Lungs/Pleura: No pneumothorax. Extensive calcified bilateral pleural
plaques, slightly asymmetrically prominent on the right. No acute
consolidative airspace disease, lung masses or significant pulmonary
nodules. No significant lobular air trapping or evidence of
tracheobronchomalacia on the expiration sequence. Moderate
curvilinear parenchymal banding at both lung bases, most prominent
in the dependent lower lobes, with associated mild patchy subpleural
reticulation and ground-glass opacity, architectural distortion,
volume loss and minimal traction bronchiolectasis. Small foci of
early honeycombing in the dependent basilar lower lobes (series
4/image 247 on the right and 258 on the left).

Upper abdomen: Subcentimeter simple posterior upper left renal cyst.

Musculoskeletal: No aggressive appearing focal osseous lesions. Mild
thoracic spondylosis.
IMPRESSION: 1. Extensive calcified bilateral pleural plaques, slightly
asymmetrically prominent on the right, compatible with asbestos
related pleural disease.
2. Spectrum of findings compatible with basilar predominant fibrotic
interstitial lung disease with small foci of early honeycombing.
Findings are compatible with UIP pattern due to asbestosis. Findings
are consistent with UIP per consensus guidelines: Diagnosis of
Idiopathic Pulmonary Fibrosis: An Official ATS/ERS/JRS/ALAT Clinical
[id number]].
3. Two-vessel coronary atherosclerosis.

## 2022-01-15 ENCOUNTER — Ambulatory Visit: Payer: Managed Care, Other (non HMO) | Admitting: Cardiology

## 2022-01-18 ENCOUNTER — Other Ambulatory Visit: Payer: Self-pay | Admitting: Cardiology

## 2022-01-18 DIAGNOSIS — I25118 Atherosclerotic heart disease of native coronary artery with other forms of angina pectoris: Secondary | ICD-10-CM

## 2022-01-22 ENCOUNTER — Other Ambulatory Visit: Payer: Self-pay | Admitting: Cardiology

## 2022-01-22 ENCOUNTER — Ambulatory Visit (INDEPENDENT_AMBULATORY_CARE_PROVIDER_SITE_OTHER): Payer: Managed Care, Other (non HMO) | Admitting: Pulmonary Disease

## 2022-01-22 ENCOUNTER — Ambulatory Visit: Payer: Managed Care, Other (non HMO) | Admitting: Pulmonary Disease

## 2022-01-22 ENCOUNTER — Encounter: Payer: Self-pay | Admitting: Pulmonary Disease

## 2022-01-22 VITALS — BP 120/68 | HR 58 | Temp 97.9°F | Ht 71.5 in | Wt 165.6 lb

## 2022-01-22 DIAGNOSIS — J61 Pneumoconiosis due to asbestos and other mineral fibers: Secondary | ICD-10-CM

## 2022-01-22 DIAGNOSIS — I10 Essential (primary) hypertension: Secondary | ICD-10-CM

## 2022-01-22 DIAGNOSIS — J849 Interstitial pulmonary disease, unspecified: Secondary | ICD-10-CM | POA: Diagnosis not present

## 2022-01-22 LAB — PULMONARY FUNCTION TEST
DL/VA % pred: 119 %
DL/VA: 4.9 ml/min/mmHg/L
DLCO cor % pred: 71 %
DLCO cor: 20.04 ml/min/mmHg
DLCO unc % pred: 71 %
DLCO unc: 20.04 ml/min/mmHg
FEF 25-75 Post: 2.9 L/sec
FEF 25-75 Pre: 2.4 L/sec
FEF2575-%Change-Post: 20 %
FEF2575-%Pred-Post: 101 %
FEF2575-%Pred-Pre: 83 %
FEV1-%Change-Post: 4 %
FEV1-%Pred-Post: 62 %
FEV1-%Pred-Pre: 59 %
FEV1-Post: 2.27 L
FEV1-Pre: 2.17 L
FEV1FVC-%Change-Post: 2 %
FEV1FVC-%Pred-Pre: 114 %
FEV6-%Change-Post: 2 %
FEV6-%Pred-Post: 56 %
FEV6-%Pred-Pre: 55 %
FEV6-Post: 2.61 L
FEV6-Pre: 2.55 L
FEV6FVC-%Pred-Post: 105 %
FEV6FVC-%Pred-Pre: 105 %
FVC-%Change-Post: 2 %
FVC-%Pred-Post: 53 %
FVC-%Pred-Pre: 52 %
FVC-Post: 2.61 L
FVC-Pre: 2.55 L
Post FEV1/FVC ratio: 87 %
Post FEV6/FVC ratio: 100 %
Pre FEV1/FVC ratio: 85 %
Pre FEV6/FVC Ratio: 100 %
RV % pred: 81 %
RV: 1.99 L
TLC % pred: 61 %
TLC: 4.49 L

## 2022-01-22 NOTE — Progress Notes (Signed)
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  with history of childhood asthma, hypertension Referred here for evaluation of pleural calcifications found on recent CT chest.  Has history of childhood asthma, history of COVID-11 April 2019.  He was not hospitalized for this Had some mild dyspnea on exertion after feels which has improved but continues to have some residual symptoms.  Denies any cough, wheezing, sputum production  Pets: No pets Occupation: Estate agent, now does maintenance work Exposures: No mold, hot tub, Jacuzzi.  No feather pillows or comforters Smoking history: Never smoker Travel history: No significant travel history Relevant family history: No family history of lung disease  Interim history: He is here for review of CT scan and PFTs States her breathing is stable without any issues.  Outpatient Encounter Medications as of 01/22/2022  Medication Sig   amLODipine (NORVASC) 5 MG tablet TAKE 1 TABLET(5 MG) BY MOUTH DAILY   ASPIRIN LOW DOSE 81 MG EC tablet TAKE 1 TABLET(81 MG) BY MOUTH DAILY. SWALLOW WHOLE   empagliflozin (JARDIANCE) 25 MG TABS tablet TAKE 1 TABLET BY MOUTH EVERY DAY AFTER FINISHING 10 MG EVERY DAY   hydrochlorothiazide (HYDRODIURIL) 25 MG tablet TAKE 1 TABLET(25 MG) BY MOUTH DAILY   metFORMIN (GLUCOPHAGE) 500 MG tablet Take 1 tablet (500 mg total) by mouth 2 (two) times daily with a meal.   metoprolol tartrate (LOPRESSOR) 25 MG tablet TAKE 1 TABLET(25 MG) BY MOUTH TWICE DAILY   nitroGLYCERIN (NITROSTAT) 0.4 MG SL tablet SMARTSIG:1 Tablet(s) Sublingual PRN   rosuvastatin (CRESTOR) 20 MG tablet Take 1 tablet (20 mg total) by mouth daily.   [DISCONTINUED] empagliflozin (JARDIANCE) 10 MG TABS tablet Take 1 tablet every day by oral route.   No  facility-administered encounter medications on file as of 01/22/2022.   Physical Exam: Blood pressure 120/68, pulse (!) 58, temperature 97.9 F (36.6 C), temperature source Oral, height 5' 11.5" (1.816 m), weight 165 lb 9.6 oz (75.1 kg), SpO2 98 %. 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  High-resolution CT 08/29/2020-pleural calcifications and thickening, UIP fibrosis.   High resolution CT 10/23/2021-pleural classifications, basal fibrosis with similar honeycombing.  UIP fibrosis consistent with asbestosis  I have reviewed the images personally.  PFTs: 10/17/2020 FVC 2.74 [65%], FEV1 2.42 [74%], F/F 88, TLC 4.50 [61%], DLCO 20.72 [73%]  01/22/2022 FVC 2.61 [53%], FEV1 2.27 [62%], F/F 87, TLC 4.49 [61%], DLCO 20.04 [100%] Moderate restriction with minimal diffusion defect  Labs:  Assessment:  Asbestosis His CT scan shows pleural calcification and scarring at the base suggestive of asbestos exposure though he denies any known known exposure History notable for childhood asthma which he  grew out of and COVID in 2020 Reviewed PFTs with restriction and diffusion impairment  So far CT and PFTs have been stable.  He would like to avoid antifibrotic treatment unless there is progression as he is concerned about side effects   Plan/Recommendations: PFTs and High-res CT in 12 months.  Marshell Garfinkel MD  Pulmonary and Critical Care 01/22/2022, 11:03 AM  CC:  Geoffrey Williams, Nicki Reaper, PA-C

## 2022-01-22 NOTE — Progress Notes (Signed)
Full PFT Performed Today  

## 2022-01-22 NOTE — Patient Instructions (Signed)
I am glad you are stable with regard to your breathing Your CT and PFTs are stable as well We will continue to monitor Order high-resolution CT and PFTs in 1 year Return to clinic after these tests.

## 2022-01-22 NOTE — Patient Instructions (Signed)
Full PFT Performed Today  

## 2022-02-26 ENCOUNTER — Encounter: Payer: Self-pay | Admitting: Cardiology

## 2022-02-26 ENCOUNTER — Ambulatory Visit: Payer: Managed Care, Other (non HMO) | Admitting: Cardiology

## 2022-02-26 VITALS — BP 126/82 | HR 55 | Resp 16 | Ht 71.0 in | Wt 165.0 lb

## 2022-02-26 DIAGNOSIS — I10 Essential (primary) hypertension: Secondary | ICD-10-CM

## 2022-02-26 DIAGNOSIS — E119 Type 2 diabetes mellitus without complications: Secondary | ICD-10-CM

## 2022-02-26 DIAGNOSIS — E782 Mixed hyperlipidemia: Secondary | ICD-10-CM

## 2022-02-26 DIAGNOSIS — I2510 Atherosclerotic heart disease of native coronary artery without angina pectoris: Secondary | ICD-10-CM

## 2022-02-26 NOTE — Progress Notes (Signed)
Subjective:   Geoffrey Williams, male    DOB: [birth year], 66 y.o.   MRN: [id number]   Chief Complaint  Patient presents with   Coronary Artery Disease   Follow-up    6 month   Results    Labs     HPI  66 y.o. African American male with hypertension, hyperlipidemia, coronary artery disease   Patient is doing well from cardiac standpoint, denies chest pain, shortness of breath, palpitations, leg edema, orthopnea, PND, TIA/syncope. He was diagnosed with diabetes in 6.2023 with A1C of 13%. This has improved significantly with diet changes and medications, down to 7.6% in 01/2022. Reviewed recent test results with the patient, details below.      Current Outpatient Medications:    amLODipine (NORVASC) 5 MG tablet, TAKE 1 TABLET(5 MG) BY MOUTH DAILY, Disp: 60 tablet, Rfl: 2   ASPIRIN LOW DOSE 81 MG EC tablet, TAKE 1 TABLET(81 MG) BY MOUTH DAILY. SWALLOW WHOLE, Disp: 90 tablet, Rfl: 3   empagliflozin (JARDIANCE) 25 MG TABS tablet, TAKE 1 TABLET BY MOUTH EVERY DAY AFTER FINISHING 10 MG EVERY DAY, Disp: , Rfl:    hydrochlorothiazide (HYDRODIURIL) 25 MG tablet, TAKE 1 TABLET(25 MG) BY MOUTH DAILY, Disp: 90 tablet, Rfl: 1   metFORMIN (GLUCOPHAGE) 500 MG tablet, Take 1 tablet (500 mg total) by mouth 2 (two) times daily with a meal., Disp: 60 tablet, Rfl: 0   metoprolol tartrate (LOPRESSOR) 25 MG tablet, TAKE 1 TABLET(25 MG) BY MOUTH TWICE DAILY, Disp: 60 tablet, Rfl: 3   nitroGLYCERIN (NITROSTAT) 0.4 MG SL tablet, SMARTSIG:1 Tablet(s) Sublingual PRN, Disp: , Rfl:    rosuvastatin (CRESTOR) 20 MG tablet, Take 1 tablet (20 mg total) by mouth daily., Disp: 90 tablet, Rfl: 4  Cardiovascular and other pertinent studies:  EKG 02/26/2022: Sinus rhythm 56 bpm Normal EKG  CCTA 07/10/2021: 1. Total coronary calcium score of 88.6. This was 76th percentile for age and sex matched control. 2. Normal coronary origin with co-dominance. 3. CAD-RADS = 2 Mild non-obstructive CAD. Left Main: Patent with  minimal luminal irregularities. LAD: Minimal stenosis (0-24%) at ostial LAD due to mixed plaque. Mild stenosis (25-49%) at proximal to mid LAD due to mixed plaque. Mid to distal and apical LAD are patent. LCX: Patent with no evidence of obstructive plaque or stenosis. RCA: Patent.  Exercise Sestamibi Stress Test 03/21/2020: Equivocal ECG stress. The patient exercised for 7 minutes and 0 seconds of a Bruce protocol, achieving approximately 8.15 METs. Stress terminated due to fatigue and dyspnea.  Peak BP was 252/82 mmHg, which is a hypertensive response to exercise. Peak EKG/ECG demonstrated normal sinus rhythm. Non-specific ST-T abnormality. During exercise the peak ECG revealed premature ventricular contractions, occasional ventricular couplets and 2 ventricular triplets. No ST-T changes of ischemia. Myocardial perfusion is normal. Overall LV systolic function is normal without regional wall motion abnormalities. Stress LV EF: 63%.  No previous exam available for comparison. Low risk study. Clinical correlation recommended.   Echocardiogram 03/14/2020:  Left ventricle cavity is normal in size and wall thickness. Normal global  wall motion. Normal LV systolic function with EF 68%. Normal diastolic  filling pattern.  Structurally normal trileaflet aortic valve.  Trace aortic regurgitation.  Mild tricuspid regurgitation.  No evidence of pulmonary hypertension.  No significant change compared to previous study in 2018.   CT Cardiac scoring 03/07/2020: Total score: 84.  LM: 33.  LAD: 36.  Cx: 15.  RCA: 0  PDA: 0   Cardiac anatomy:  Normal  heart size, no evidence of pericardial effusion  Mediastinum: No adenopathy  Visible abdomen: No significant abnormality  Visible lung fields: Bilateral pleural calcifications suggestive of asbestos exposure.   Recent labs: 01/29/2022: Glucose 154, BUN/Cr 16/1.11. EGFR 74. Na/K 141/3.8. Rest of the CMP normal HbA1C 7.6% Chol 108, TG 117, HDL 39,  LDL 49  10/10/2021: Glucose 423, BUN/Cr 14/1.15. EGFR >60. Na/K 136/3.6 HbA1C 13.0%  07/03/2021: Chol 152 TG 264, HDL 40, LDL 69   Review of Systems  Cardiovascular:  Positive for palpitations. Negative for chest pain, dyspnea on exertion, leg swelling and syncope.         Vitals:   02/26/22 1300  BP: 126/82  Pulse: (!) 55  Resp: 16  SpO2: 95%      Body mass index is 23.01 kg/m. Filed Weights   02/26/22 1300  Weight: 165 lb (74.8 kg)      Objective:   Physical Exam Vitals and nursing note reviewed.  Constitutional:      General: He is not in acute distress. Neck:     Vascular: No JVD.  Cardiovascular:     Rate and Rhythm: Normal rate and regular rhythm.     Heart sounds: Normal heart sounds. No murmur heard. Pulmonary:     Effort: Pulmonary effort is normal.     Breath sounds: Normal breath sounds. No wheezing or rales.  Musculoskeletal:     Right lower leg: No edema.     Left lower leg: No edema.        ICD-10-CM   1. Coronary artery disease involving native coronary artery of native heart without angina pectoris  I25.10 EKG 12-Lead           Assessment & Recommendations:   66 y.o. African American male with hypertension, hyperlipidemia, coronary artery disease   CAD: Seen on CT cardiac scoring 02/2020.  Calcium score of 84, including 33 in left main. No obstructive disease noted on coronary CT angiogram 06/2021. Continue aggressive risk factor modification. In absence of bleeding, continue Aspirin 81 mg daily, Crestor 20 mg daily. Lipids well controlled. Continue management of diabetes as per PCP.  Hypertension: Well-controlled  F/u in 1 year    Geoffrey Mormon, MD Pager: [phone] Office: [phone]

## 2022-06-04 ENCOUNTER — Encounter: Payer: Self-pay | Admitting: Gastroenterology

## 2022-07-03 ENCOUNTER — Other Ambulatory Visit: Payer: Self-pay | Admitting: Cardiology

## 2022-07-03 DIAGNOSIS — I25118 Atherosclerotic heart disease of native coronary artery with other forms of angina pectoris: Secondary | ICD-10-CM

## 2022-07-07 ENCOUNTER — Other Ambulatory Visit: Payer: Self-pay | Admitting: Cardiology

## 2022-07-07 DIAGNOSIS — I2510 Atherosclerotic heart disease of native coronary artery without angina pectoris: Secondary | ICD-10-CM

## 2022-07-07 DIAGNOSIS — I2089 Other forms of angina pectoris: Secondary | ICD-10-CM

## 2022-07-07 DIAGNOSIS — E782 Mixed hyperlipidemia: Secondary | ICD-10-CM

## 2022-07-07 DIAGNOSIS — I25118 Atherosclerotic heart disease of native coronary artery with other forms of angina pectoris: Secondary | ICD-10-CM

## 2022-07-09 ENCOUNTER — Other Ambulatory Visit: Payer: Self-pay | Admitting: Cardiology

## 2022-07-09 DIAGNOSIS — I10 Essential (primary) hypertension: Secondary | ICD-10-CM

## 2022-07-30 ENCOUNTER — Ambulatory Visit: Payer: Managed Care, Other (non HMO) | Admitting: Gastroenterology

## 2022-07-30 ENCOUNTER — Encounter: Payer: Self-pay | Admitting: Gastroenterology

## 2022-07-30 VITALS — BP 130/78 | HR 77 | Ht 72.0 in | Wt 160.0 lb

## 2022-07-30 DIAGNOSIS — R09A2 Foreign body sensation, throat: Secondary | ICD-10-CM

## 2022-07-30 DIAGNOSIS — Z1211 Encounter for screening for malignant neoplasm of colon: Secondary | ICD-10-CM | POA: Diagnosis not present

## 2022-07-30 DIAGNOSIS — R1310 Dysphagia, unspecified: Secondary | ICD-10-CM

## 2022-07-30 DIAGNOSIS — R634 Abnormal weight loss: Secondary | ICD-10-CM | POA: Diagnosis not present

## 2022-07-30 DIAGNOSIS — I2510 Atherosclerotic heart disease of native coronary artery without angina pectoris: Secondary | ICD-10-CM

## 2022-07-30 MED ORDER — NA SULFATE-K SULFATE-MG SULF 17.5-3.13-1.6 GM/177ML PO SOLN: 1.0000 | Freq: Once | ORAL | 0 refills | Status: AC

## 2022-07-30 NOTE — Patient Instructions (Addendum)
   _______________________________________________________  If your blood pressure at your visit was 140/90 or greater, please contact your primary care physician to follow up on this.  _______________________________________________________ You have been scheduled for an endoscopy and colonoscopy. Please follow the written instructions given to you at your visit today. Please pick up your prep supplies at the pharmacy within the next 1-3 days. If you use inhalers (even only as needed), please bring them with you on the day of your procedure.   If you are age 66 or older, your body mass index should be between 23-30. Your Body mass index is 21.7 kg/m. If this is out of the aforementioned range listed, please consider follow up with your Primary Care Provider.  __________________________________________________________  The [facility] providers would like to encourage you to use [facility] to communicate with providers for non-urgent requests or questions.  Due to long hold times on the telephone, sending your provider a message by [facility] may be a faster and more efficient way to get a response.  Please allow 48 business hours for a response.  Please remember that this is for non-urgent requests.   Due to recent changes in healthcare laws, you may see the results of your imaging and laboratory studies on MyChart before your provider has had a chance to review them.  We understand that in some cases there may be results that are confusing or concerning to you. Not all laboratory results come back in the same time frame and the provider may be waiting for multiple results in order to interpret others.  Please give [place] 48 hours in order for your provider to thoroughly review all the results before contacting the office for clarification of your results.    Thank you for choosing me and [facility].  Vito Cirigliano, D.O.

## 2022-07-30 NOTE — Progress Notes (Signed)
Chief Complaint: Weight loss, dysphagia   Referring Provider:     Lindaann Pascal, PA-C   HPI:     Geoffrey Williams is a 67 y.o. male with history of HTN, HLD, CAD (follows in the Cardiology Clinic), diabetes, asbestosis, referred to the Gastroenterology Clinic for evaluation of unintentional weight loss and dysphagia.  Has had solid food dysphagia for the last few years, pointing to suprasternal notch. Sxs progressively worse over last year or so. Can have feeling that something "is caught there" even outside meal times. Sometimes feels like he has phlegm that needs to be cleared with cough.   Reports losing 20# over 2-3 months near end of 2023.  Thinks this weight loss coincided with URI symptoms, cough, congestion.  160# today, from reported baseline 170-175, but reports being 150# at [facility] office last month.  Otherwise, no lower GI sxs, hematochezia, melena, abdominal pain, nausea, vomiting, early satiety. No reflux sxs.   No prior EGD. Last colonoscopy was well over 10 years ago at outside facility and normal per patient.    Was diagnosed diabetes in 09/2021 with A1c of 13%, improved with dietary modifications and medications.  Most recent A1c 7.6% in 01/2022.   -10/10/2021: CT A/P (indication: Blunt trauma): Normal liver, pancreas, spleen, visualized GI tract.  Nonspecific misty mesentery with associated prominent mesenteric lymph nodes - 10/23/2021: CT chest: Normal visualized upper abdomen  No known family history of CRC, GI malignancy, liver disease, pancreatic disease, or IBD.     Past Medical History:  Diagnosis Date   Asthma    Hypertension      Past Surgical History:  Procedure Laterality Date   arm surgery Right    Family History  Problem Relation Age of Onset   Hypertension Mother    Dementia Mother    Hypertension Father    Diabetes Father    CAD Father    Cancer Sister    CAD Brother    CAD Brother    Parkinson's disease Brother    Diabetes  Brother    Liver disease Neg Hx    Esophageal cancer Neg Hx    Colon cancer Neg Hx    Social History   Tobacco Use   Smoking status: Never   Smokeless tobacco: Never  Vaping Use   Vaping Use: Never used  Substance Use Topics   Alcohol use: Not Currently   Drug use: Not Currently    Types: Marijuana   Current Outpatient Medications  Medication Sig Dispense Refill   amLODipine (NORVASC) 5 MG tablet TAKE 1 TABLET(5 MG) BY MOUTH DAILY 60 tablet 2   ASPIRIN LOW DOSE 81 MG tablet TAKE 1 TABLET(81 MG) BY MOUTH DAILY. SWALLOW WHOLE 90 tablet 3   empagliflozin (JARDIANCE) 25 MG TABS tablet TAKE 1 TABLET BY MOUTH EVERY DAY AFTER FINISHING 10 MG EVERY DAY     hydrochlorothiazide (HYDRODIURIL) 25 MG tablet TAKE 1 TABLET(25 MG) BY MOUTH DAILY 90 tablet 2   rosuvastatin (CRESTOR) 20 MG tablet TAKE 1 TABLET(20 MG) BY MOUTH DAILY 90 tablet 3   metFORMIN (GLUCOPHAGE) 500 MG tablet Take 1 tablet (500 mg total) by mouth 2 (two) times daily with a meal. (Patient not taking: Reported on 07/30/2022) 60 tablet 0   metoprolol tartrate (LOPRESSOR) 25 MG tablet TAKE 1 TABLET(25 MG) BY MOUTH TWICE DAILY (Patient not taking: Reported on 07/30/2022) 60 tablet 3   nitroGLYCERIN (NITROSTAT) 0.4 MG SL tablet  SMARTSIG:1 Tablet(s) Sublingual PRN (Patient not taking: Reported on 07/30/2022)     No current facility-administered medications for this visit.   No Known Allergies   Review of Systems: All systems reviewed and negative except where noted in HPI.     Physical Exam:    Wt Readings from Last 3 Encounters:  07/30/22 160 lb (72.6 kg)  02/26/22 165 lb (74.8 kg)  01/22/22 165 lb 9.6 oz (75.1 kg)    BP 130/78   Pulse 77   Ht 6' (1.829 m)   Wt 160 lb (72.6 kg)   BMI 21.70 kg/m  Constitutional:  Pleasant, in no acute distress. Psychiatric: Normal mood and affect. Behavior is normal. Cardiovascular: Normal rate, regular rhythm. No edema Pulmonary/chest: Effort normal and breath sounds normal. No  wheezing, rales or rhonchi. Abdominal: Soft, nondistended, nontender. Bowel sounds active throughout. There are no masses palpable. No hepatomegaly. Neurological: Alert and oriented to person place and time. Skin: Skin is warm and dry. No rashes noted.   ASSESSMENT AND PLAN;   1) Dysphagia 2) Globus sensation 3) Unintentional weight loss - EGD with esophageal dilation and/or biopsies as appropriate - Encouraged to cut food into small pieces, chew thoroughly, drink plenty fluids with meals - Further assess unintentional weight loss with colonoscopy as well as below - Evaluate for erosive esophagitis, LES laxity, hiatal hernia, etc. time of EGD  4) Colon cancer screening - Schedule colonoscopy  5) CAD - Ok to continue ASA 81 mg in the perioperative setting  The indications, risks, and benefits of EGD and colonoscopy were explained to the patient in detail. Risks include but are not limited to bleeding, perforation, adverse reaction to medications, and cardiopulmonary compromise. Sequelae include but are not limited to the possibility of surgery, hospitalization, and mortality. The patient verbalized understanding and wished to proceed. All questions answered, referred to scheduler and bowel prep ordered. Further recommendations pending results of the exam.    Shellia Cleverly, DO, FACG  07/30/2022, 8:34 AM   Long, [name], PA-C

## 2022-08-05 ENCOUNTER — Other Ambulatory Visit: Payer: Self-pay | Admitting: Cardiology

## 2022-08-05 DIAGNOSIS — I10 Essential (primary) hypertension: Secondary | ICD-10-CM

## 2022-09-05 ENCOUNTER — Ambulatory Visit: Payer: Managed Care, Other (non HMO) | Admitting: Gastroenterology

## 2022-09-05 ENCOUNTER — Encounter: Payer: Self-pay | Admitting: Gastroenterology

## 2022-09-05 VITALS — BP 132/74 | HR 53 | Temp 97.5°F | Resp 11 | Ht 72.0 in | Wt 160.0 lb

## 2022-09-05 DIAGNOSIS — R09A2 Foreign body sensation, throat: Secondary | ICD-10-CM

## 2022-09-05 DIAGNOSIS — D122 Benign neoplasm of ascending colon: Secondary | ICD-10-CM

## 2022-09-05 DIAGNOSIS — D125 Benign neoplasm of sigmoid colon: Secondary | ICD-10-CM | POA: Diagnosis not present

## 2022-09-05 DIAGNOSIS — D123 Benign neoplasm of transverse colon: Secondary | ICD-10-CM

## 2022-09-05 DIAGNOSIS — R634 Abnormal weight loss: Secondary | ICD-10-CM

## 2022-09-05 DIAGNOSIS — K5730 Diverticulosis of large intestine without perforation or abscess without bleeding: Secondary | ICD-10-CM

## 2022-09-05 DIAGNOSIS — R1310 Dysphagia, unspecified: Secondary | ICD-10-CM

## 2022-09-05 DIAGNOSIS — Z1211 Encounter for screening for malignant neoplasm of colon: Secondary | ICD-10-CM

## 2022-09-05 DIAGNOSIS — K635 Polyp of colon: Secondary | ICD-10-CM | POA: Diagnosis not present

## 2022-09-05 MED ORDER — SODIUM CHLORIDE 0.9 % IV SOLN: 500.0000 mL | Freq: Once | INTRAVENOUS | Status: DC

## 2022-09-05 NOTE — Progress Notes (Signed)
Uneventful anesthetic. Report to pacu rn. Vss. Care resumed by rn. 

## 2022-09-05 NOTE — Op Note (Signed)
[facility] Patient Name: Geoffrey Williams Procedure Date: 09/05/2022 2:45 PM MRN: [id number] Endoscopist: Doristine Locks , MD, [id number] Age: 67 Referring MD:  Date of Birth: October 01, 1955 Gender: Male Account #: [id number] Procedure:                Upper GI endoscopy Indications:              Dysphagia, Globus sensation, Weight loss Medicines:                Monitored Anesthesia Care Procedure:                Pre-Anesthesia Assessment:                           - Prior to the procedure, a History and Physical                            was performed, and patient medications and                            allergies were reviewed. The patient's tolerance of                            previous anesthesia was also reviewed. The risks                            and benefits of the procedure and the sedation                            options and risks were discussed with the patient.                            All questions were answered, and informed consent                            was obtained. Prior Anticoagulants: The patient has                            taken no anticoagulant or antiplatelet agents. ASA                            Grade Assessment: II - A patient with mild systemic                            disease. After reviewing the risks and benefits,                            the patient was deemed in satisfactory condition to                            undergo the procedure.                           After obtaining informed consent, the endoscope was  passed under direct vision. Throughout the                            procedure, the patient's blood pressure, pulse, and                            oxygen saturations were monitored continuously. The                            Olympus Scope [id number] was introduced through the                            mouth, and advanced to the second part of duodenum.                            The upper GI  endoscopy was accomplished without                            difficulty. The patient tolerated the procedure                            well. Scope In: Scope Out: Findings:                 The examined esophagus was normal. A guidewire was                            placed and the scope was withdrawn. Dilation was                            performed with a Savary dilator with mild                            resistance at 17 mm. The dilation site was examined                            following endoscope reinsertion and showed no                            bleeding, mucosal tear or perforation. Biopsies                            were obtained from the proximal and distal                            esophagus with cold forceps for histology of                            suspected eosinophilic esophagitis. Estimated blood                            loss was minimal.                           The Z-line was regular and  was found 40 cm from the                            incisors.                           The entire examined stomach was normal.                           The examined duodenum was normal. Complications:            No immediate complications. Estimated Blood Loss:     Estimated blood loss was minimal. Impression:               - Normal esophagus. Dilated with 17 mm Savary                            dilator. Biopsies were taken with a cold forceps                            for evaluation of eosinophilic esophagitis.                           - Z-line regular, 40 cm from the incisors.                           - Normal stomach.                           - Normal examined duodenum. Recommendation:           - Patient has a contact number available for                            emergencies. The signs and symptoms of potential                            delayed complications were discussed with the                            patient. Return to normal activities tomorrow.                             Written discharge instructions were provided to the                            patient.                           - Advance diet as tolerated.                           - Continue present medications.                           - Await pathology results.                           -  Repeat upper endoscopy PRN.                           - Return to GI clinic PRN. Doristine Locks, MD 09/05/2022 3:20:58 PM

## 2022-09-05 NOTE — Patient Instructions (Signed)
Please read handouts provided. Continue present medications. Await pathology results. Repeat upper endoscopy as needed. Return to GI clinic as needed. Advance diet as tolerated.   YOU HAD AN ENDOSCOPIC PROCEDURE TODAY AT THE [facility]:   Refer to the procedure report that was given to you for any specific questions about what was found during the examination.  If the procedure report does not answer your questions, please call your gastroenterologist to clarify.  If you requested that your care partner not be given the details of your procedure findings, then the procedure report has been included in a sealed envelope for you to review at your convenience later.  YOU SHOULD EXPECT: Some feelings of bloating in the abdomen. Passage of more gas than usual.  Walking can help get rid of the air that was put into your GI tract during the procedure and reduce the bloating. If you had a lower endoscopy (such as a colonoscopy or flexible sigmoidoscopy) you may notice spotting of blood in your stool or on the toilet paper. If you underwent a bowel prep for your procedure, you may not have a normal bowel movement for a few days.  Please Note:  You might notice some irritation and congestion in your nose or some drainage.  This is from the oxygen used during your procedure.  There is no need for concern and it should clear up in a day or so.  SYMPTOMS TO REPORT IMMEDIATELY:  Following lower endoscopy (colonoscopy or flexible sigmoidoscopy):  Excessive amounts of blood in the stool  Significant tenderness or worsening of abdominal pains  Swelling of the abdomen that is new, acute  Fever of 100F or higher  Following upper endoscopy (EGD)  Vomiting of blood or coffee ground material  New chest pain or pain under the shoulder blades  Painful or persistently difficult swallowing  New shortness of breath  Fever of 100F or higher  Black, tarry-looking stools  For urgent or emergent  issues, a gastroenterologist can be reached at any hour by calling [phone]. Do not use MyChart messaging for urgent concerns.    DIET:  We do recommend a small meal at first, but then you may proceed to your regular diet.  Drink plenty of fluids but you should avoid alcoholic beverages for 24 hours.  ACTIVITY:  You should plan to take it easy for the rest of today and you should NOT DRIVE or use heavy machinery until tomorrow (because of the sedation medicines used during the test).    FOLLOW UP: Our staff will call the number listed on your records the next business day following your procedure.  We will call around 7:15- 8:00 am to check on you and address any questions or concerns that you may have regarding the information given to you following your procedure. If we do not reach you, we will leave a message.     If any biopsies were taken you will be contacted by phone or by letter within the next 1-3 weeks.  Please call us at [phone] if you have not heard about the biopsies in 3 weeks.    SIGNATURES/CONFIDENTIALITY: You and/or your care partner have signed paperwork which will be entered into your electronic medical record.  These signatures attest to the fact that that the information above on your After Visit Summary has been reviewed and is understood.  Full responsibility of the confidentiality of this discharge information lies with you and/or your care-partner.

## 2022-09-05 NOTE — Progress Notes (Signed)
Pt's states no medical or surgical changes since previsit or office visit. 

## 2022-09-05 NOTE — Progress Notes (Signed)
Called to room to assist during endoscopic procedure.  Patient ID and intended procedure confirmed with present staff. Received instructions for my participation in the procedure from the performing physician.  

## 2022-09-05 NOTE — Op Note (Signed)
[facility] Patient Name: Geoffrey Williams Procedure Date: 09/05/2022 2:34 PM MRN: [id number] Endoscopist: Doristine Locks , MD, [id number] Age: 67 Referring MD:  Date of Birth: Nov 21, 1955 Gender: Male Account #: [id number] Procedure:                Colonoscopy Indications:              Screening for colorectal malignant neoplasm, This                            is the patient's first colonoscopy Medicines:                Monitored Anesthesia Care Procedure:                Pre-Anesthesia Assessment:                           - Prior to the procedure, a History and Physical                            was performed, and patient medications and                            allergies were reviewed. The patient's tolerance of                            previous anesthesia was also reviewed. The risks                            and benefits of the procedure and the sedation                            options and risks were discussed with the patient.                            All questions were answered, and informed consent                            was obtained. Prior Anticoagulants: The patient has                            taken no anticoagulant or antiplatelet agents. ASA                            Grade Assessment: II - A patient with mild systemic                            disease. After reviewing the risks and benefits,                            the patient was deemed in satisfactory condition to                            undergo the procedure.  After obtaining informed consent, the colonoscope                            was passed under direct vision. Throughout the                            procedure, the patient's blood pressure, pulse, and                            oxygen saturations were monitored continuously. The                            CF [id number] #[id number] was introduced through the anus                            and advanced to the the  terminal ileum. The                            colonoscopy was performed without difficulty. The                            patient tolerated the procedure well. The quality                            of the bowel preparation was good. The terminal                            ileum, ileocecal valve, appendiceal orifice, and                            rectum were photographed. Scope In: 3:00:12 PM Scope Out: 3:15:39 PM Scope Withdrawal Time: 0 hours 13 minutes 4 seconds  Total Procedure Duration: 0 hours 15 minutes 27 seconds  Findings:                 The perianal and digital rectal examinations were                            normal.                           Three sessile polyps were found in the sigmoid                            colon, transverse colon, and ascending colon. The                            polyps were 2 to 4 mm in size. These polyps were                            removed with a cold snare. Resection and retrieval                            were complete. Estimated blood loss was minimal.  A few small-mouthed diverticula were found in the                            sigmoid colon and transverse colon.                           The retroflexed view of the distal rectum and anal                            verge was normal and showed no anal or rectal                            abnormalities.                           The terminal ileum appeared normal. Complications:            No immediate complications. Estimated Blood Loss:     Estimated blood loss was minimal. Impression:               - Three 2 to 4 mm polyps in the sigmoid colon, in                            the transverse colon and in the ascending colon,                            removed with a cold snare. Resected and retrieved.                           - Diverticulosis in the sigmoid colon and in the                            transverse colon.                           - The distal  rectum and anal verge are normal on                            retroflexion view.                           - The examined portion of the ileum was normal.                           - The GI Genius (intelligent endoscopy module),                            computer-aided polyp detection system powered by AI                            was utilized to detect colorectal polyps through                            enhanced visualization during colonoscopy. Recommendation:           -  Patient has a contact number available for                            emergencies. The signs and symptoms of potential                            delayed complications were discussed with the                            patient. Return to normal activities tomorrow.                            Written discharge instructions were provided to the                            patient.                           - Resume previous diet.                           - Continue present medications.                           - Await pathology results.                           - Repeat colonoscopy for surveillance based on                            pathology results.                           - Return to GI office PRN. Doristine Locks, MD 09/05/2022 3:24:11 PM

## 2022-09-05 NOTE — Progress Notes (Signed)
GASTROENTEROLOGY PROCEDURE H&P NOTE   Primary Care Physician: Lindaann Pascal, PA-C    Reason for Procedure:   Weight loss, dysphagia, globus sensation, CRC screening  Plan:    EGD, colonoscopy  Patient is appropriate for endoscopic procedure(s) in the ambulatory ([facility]) setting.  The nature of the procedure, as well as the risks, benefits, and alternatives were carefully and thoroughly reviewed with the patient. Ample time for discussion and questions allowed. The patient understood, was satisfied, and agreed to proceed.     HPI: Geoffrey Williams is a 67 y.o. male who presents for EGD and colonoscopy for evaluation of weight loss, dysphagia, globus sensation, CRC screening.   No changes in clinical hx since last OV w/ me on 07/30/2022.    Past Medical History:  Diagnosis Date   Arthritis    Asthma    Diabetes mellitus without complication (HCC)    Hypertension     Past Surgical History:  Procedure Laterality Date   arm surgery Right     Prior to Admission medications   Medication Sig Start Date End Date Taking? Authorizing Provider  amLODipine (NORVASC) 5 MG tablet TAKE 1 TABLET(5 MG) BY MOUTH DAILY 08/06/22  Yes Patwardhan, Manish J, MD  ASPIRIN LOW DOSE 81 MG tablet TAKE 1 TABLET(81 MG) BY MOUTH DAILY. SWALLOW WHOLE 07/09/22  Yes Patwardhan, Manish J, MD  hydrochlorothiazide (HYDRODIURIL) 25 MG tablet TAKE 1 TABLET(25 MG) BY MOUTH DAILY 07/09/22  Yes Patwardhan, Manish J, MD  metoprolol tartrate (LOPRESSOR) 25 MG tablet TAKE 1 TABLET(25 MG) BY MOUTH TWICE DAILY 07/06/22  Yes Patwardhan, Manish J, MD  rosuvastatin (CRESTOR) 20 MG tablet TAKE 1 TABLET(20 MG) BY MOUTH DAILY 07/09/22  Yes Patwardhan, Manish J, MD  empagliflozin (JARDIANCE) 25 MG TABS tablet TAKE 1 TABLET BY MOUTH EVERY DAY AFTER FINISHING 10 MG EVERY DAY Patient not taking: Reported on 09/05/2022    [provider]  metFORMIN (GLUCOPHAGE) 500 MG tablet Take 1 tablet (500 mg total) by mouth 2 (two) times daily  with a meal. Patient not taking: Reported on 09/05/2022 10/10/21   Benjiman Core, MD  nitroGLYCERIN (NITROSTAT) 0.4 MG SL tablet SMARTSIG:1 Tablet(s) Sublingual PRN Patient not taking: Reported on 07/30/2022 02/16/20   [provider]    Current Outpatient Medications  Medication Sig Dispense Refill   amLODipine (NORVASC) 5 MG tablet TAKE 1 TABLET(5 MG) BY MOUTH DAILY 60 tablet 2   ASPIRIN LOW DOSE 81 MG tablet TAKE 1 TABLET(81 MG) BY MOUTH DAILY. SWALLOW WHOLE 90 tablet 3   hydrochlorothiazide (HYDRODIURIL) 25 MG tablet TAKE 1 TABLET(25 MG) BY MOUTH DAILY 90 tablet 2   metoprolol tartrate (LOPRESSOR) 25 MG tablet TAKE 1 TABLET(25 MG) BY MOUTH TWICE DAILY 60 tablet 3   rosuvastatin (CRESTOR) 20 MG tablet TAKE 1 TABLET(20 MG) BY MOUTH DAILY 90 tablet 3   empagliflozin (JARDIANCE) 25 MG TABS tablet TAKE 1 TABLET BY MOUTH EVERY DAY AFTER FINISHING 10 MG EVERY DAY (Patient not taking: Reported on 09/05/2022)     metFORMIN (GLUCOPHAGE) 500 MG tablet Take 1 tablet (500 mg total) by mouth 2 (two) times daily with a meal. (Patient not taking: Reported on 09/05/2022) 60 tablet 0   nitroGLYCERIN (NITROSTAT) 0.4 MG SL tablet SMARTSIG:1 Tablet(s) Sublingual PRN (Patient not taking: Reported on 07/30/2022)     Current Facility-Administered Medications  Medication Dose Route Frequency Provider Last Rate Last Admin   0.9 %  sodium chloride infusion  500 mL Intravenous Once Merari Pion V, DO  Allergies as of 09/05/2022   (No Known Allergies)    Family History  Problem Relation Age of Onset   Hypertension Mother    Dementia Mother    Hypertension Father    Diabetes Father    CAD Father    Cancer Sister    CAD Brother    CAD Brother    Parkinson's disease Brother    Diabetes Brother    Liver disease Neg Hx    Esophageal cancer Neg Hx    Colon cancer Neg Hx     Social History   Socioeconomic History   Marital status: Single    Spouse name: Not on file   Number of  children: 1   Years of education: Not on file   Highest education level: Not on file  Occupational History   Occupation: Nutritional therapist  Tobacco Use   Smoking status: Never   Smokeless tobacco: Never  Vaping Use   Vaping Use: Never used  Substance and Sexual Activity   Alcohol use: Not Currently   Drug use: Not Currently    Types: Marijuana   Sexual activity: Yes  Other Topics Concern   Not on file  Social History Narrative   Not on file   Social Determinants of Health   Financial Resource Strain: Not on file  Food Insecurity: Not on file  Transportation Needs: Not on file  Physical Activity: Not on file  Stress: Not on file  Social Connections: Not on file  Intimate Partner Violence: Not on file    Physical Exam: Vital signs in last 24 hours: @BP  105/70   Pulse (!) 58   Temp (!) 97.5 F (36.4 C) (Skin)   Ht 6' (1.829 m)   Wt 160 lb (72.6 kg)   SpO2 99%   BMI 21.70 kg/m  GEN: NAD EYE: Sclerae anicteric ENT: MMM CV: Non-tachycardic Pulm: CTA b/l GI: Soft, NT/ND NEURO:  Alert & Oriented x 3   Doristine Locks, DO [facility]   09/05/2022 2:43 PM

## 2022-09-06 ENCOUNTER — Telehealth: Payer: Self-pay | Admitting: *Deleted

## 2022-09-06 NOTE — Telephone Encounter (Signed)
Attempted f/u phone call. No answer. Left message. °

## 2022-09-07 NOTE — Telephone Encounter (Signed)
Inbound call from patient experiencing abdominal pain. Please advise.  Thank you

## 2022-09-11 ENCOUNTER — Encounter: Payer: Self-pay | Admitting: Gastroenterology

## 2022-11-07 ENCOUNTER — Other Ambulatory Visit: Payer: Self-pay | Admitting: Cardiology

## 2022-11-07 DIAGNOSIS — I25118 Atherosclerotic heart disease of native coronary artery with other forms of angina pectoris: Secondary | ICD-10-CM

## 2023-01-21 ENCOUNTER — Other Ambulatory Visit: Payer: Self-pay | Admitting: Pulmonary Disease

## 2023-01-21 DIAGNOSIS — J849 Interstitial pulmonary disease, unspecified: Secondary | ICD-10-CM

## 2023-01-22 ENCOUNTER — Other Ambulatory Visit: Payer: Managed Care, Other (non HMO)

## 2023-02-05 ENCOUNTER — Inpatient Hospital Stay: Admission: RE | Admit: 2023-02-05 | Payer: Managed Care, Other (non HMO) | Source: Ambulatory Visit

## 2023-02-14 ENCOUNTER — Ambulatory Visit: Payer: Managed Care, Other (non HMO) | Admitting: Pulmonary Disease

## 2023-02-20 ENCOUNTER — Ambulatory Visit
Admission: RE | Admit: 2023-02-20 | Discharge: 2023-02-20 | Disposition: A | Discharge status: Home / Self Care | Payer: Medicare Other | Source: Ambulatory Visit | Attending: Pulmonary Disease

## 2023-02-20 DIAGNOSIS — J849 Interstitial pulmonary disease, unspecified: Secondary | ICD-10-CM

## 2023-02-27 ENCOUNTER — Ambulatory Visit: Payer: Self-pay | Admitting: Cardiology

## 2023-03-02 ENCOUNTER — Other Ambulatory Visit: Payer: Self-pay | Admitting: Cardiology

## 2023-03-02 DIAGNOSIS — I10 Essential (primary) hypertension: Secondary | ICD-10-CM

## 2023-03-02 DIAGNOSIS — I25118 Atherosclerotic heart disease of native coronary artery with other forms of angina pectoris: Secondary | ICD-10-CM

## 2023-03-05 ENCOUNTER — Other Ambulatory Visit: Payer: Self-pay | Admitting: Cardiology

## 2023-03-05 DIAGNOSIS — I25118 Atherosclerotic heart disease of native coronary artery with other forms of angina pectoris: Secondary | ICD-10-CM

## 2023-03-09 ENCOUNTER — Other Ambulatory Visit: Payer: Self-pay | Admitting: Cardiology

## 2023-03-09 DIAGNOSIS — I25118 Atherosclerotic heart disease of native coronary artery with other forms of angina pectoris: Secondary | ICD-10-CM

## 2023-03-10 ENCOUNTER — Other Ambulatory Visit: Payer: Self-pay | Admitting: Cardiology

## 2023-03-10 DIAGNOSIS — I10 Essential (primary) hypertension: Secondary | ICD-10-CM

## 2023-04-02 ENCOUNTER — Other Ambulatory Visit: Payer: Self-pay | Admitting: Cardiology

## 2023-04-02 DIAGNOSIS — I10 Essential (primary) hypertension: Secondary | ICD-10-CM

## 2023-05-17 ENCOUNTER — Ambulatory Visit: Payer: Medicare PPO | Attending: Cardiology | Admitting: Cardiology

## 2023-05-17 ENCOUNTER — Encounter: Payer: Self-pay | Admitting: Cardiology

## 2023-05-17 VITALS — BP 134/70 | HR 52 | Resp 16 | Ht 72.0 in | Wt 165.0 lb

## 2023-05-17 DIAGNOSIS — E782 Mixed hyperlipidemia: Secondary | ICD-10-CM | POA: Diagnosis not present

## 2023-05-17 DIAGNOSIS — E119 Type 2 diabetes mellitus without complications: Secondary | ICD-10-CM

## 2023-05-17 DIAGNOSIS — I2510 Atherosclerotic heart disease of native coronary artery without angina pectoris: Secondary | ICD-10-CM | POA: Diagnosis not present

## 2023-05-17 DIAGNOSIS — I10 Essential (primary) hypertension: Secondary | ICD-10-CM

## 2023-05-17 MED ORDER — ROSUVASTATIN CALCIUM 20 MG PO TABS: 20.0000 mg | ORAL_TABLET | Freq: Every day | ORAL | 3 refills | Status: AC

## 2023-05-17 MED ORDER — EMPAGLIFLOZIN 25 MG PO TABS: 25.0000 mg | ORAL_TABLET | Freq: Every day | ORAL | 3 refills | Status: DC

## 2023-05-17 MED ORDER — LOSARTAN POTASSIUM 25 MG PO TABS: 25.0000 mg | ORAL_TABLET | Freq: Every day | ORAL | 3 refills | Status: AC

## 2023-05-17 MED ORDER — ASPIRIN 81 MG PO TBEC: 81.0000 mg | DELAYED_RELEASE_TABLET | Freq: Every day | ORAL | 3 refills | Status: AC

## 2023-05-17 MED ORDER — HYDROCHLOROTHIAZIDE 25 MG PO TABS: 25.0000 mg | ORAL_TABLET | Freq: Every day | ORAL | 3 refills | Status: AC

## 2023-05-17 MED ORDER — NITROGLYCERIN 0.4 MG SL SUBL: 0.4000 mg | SUBLINGUAL_TABLET | SUBLINGUAL | 4 refills | Status: AC | PRN

## 2023-05-17 NOTE — Progress Notes (Signed)
Cardiology Office Note:  .   Date:  05/17/2023  ID:  Geoffrey Williams, DOB [birth year], MRN [id number] PCP: Mike Craze  [facility] Providers Cardiologist:  Truett Mainland, MD PCP: Lindaann Pascal, PA-C  Chief Complaint  Patient presents with   Coronary Artery Disease   Follow-up    1 year      History of Present Illness: Geoffrey Williams is a 68 y.o. male with hypertension, hyperlipidemia, type 2 diabetes mellitus, coronary artery disease    Patient has noted illnesses, but stays active around the house.  He denies any complains of chest pain, shortness of breath, palpitations.  He has not seen me or his PCP in over 1 year.  Previously, he was incidentally found to have uncontrolled type 2 diabetes mellitus with A1c of 13%.  He was on metformin and Jardiance, but has ran out of them for quite some time.  He had diarrhea with metformin and does not want to try it again.  He currently does not have a PCP.   Vitals:   05/17/23 1610 05/17/23 1615  BP: (!) 150/70 134/70  Pulse: (!) 52   Resp: 16   SpO2: 95%      ROS:  Review of Systems  Cardiovascular:  Negative for chest pain, dyspnea on exertion, leg swelling, palpitations and syncope.     Studies Reviewed: Marland Kitchen        EKG 05/17/2023: Normal sinus rhythm Nonspecific T wave abnormality No previous ECGs available     No recent labs available    Physical Exam:   Physical Exam Vitals and nursing note reviewed.  Constitutional:      General: He is not in acute distress. Neck:     Vascular: No JVD.  Cardiovascular:     Rate and Rhythm: Normal rate and regular rhythm.     Heart sounds: Normal heart sounds. No murmur heard. Pulmonary:     Effort: Pulmonary effort is normal.     Breath sounds: Normal breath sounds. No wheezing or rales.  Musculoskeletal:     Right lower leg: No edema.     Left lower leg: No edema.      VISIT DIAGNOSES:   ICD-10-CM   1. Coronary artery disease involving native  coronary artery of native heart without angina pectoris  I25.10 EKG 12-Lead    2. Type 2 diabetes mellitus without complication, without long-term current use of insulin (HCC)  E11.9     3. Mixed hyperlipidemia  E78.2     4. Essential hypertension  I10        ASSESSMENT AND PLAN: .    Geoffrey Williams is a 68 y.o. male with hypertension, hyperlipidemia, type 2 diabetes mellitus, coronary artery disease  hypertension, hyperlipidemia, coronary artery disease    CAD: Seen on CT cardiac scoring 02/2020.  Calcium score of 84, including 33 in left main. No obstructive disease noted on coronary CT angiogram 06/2021. Continue aggressive risk factor modification. In absence of bleeding, continue Aspirin 81 mg daily, Crestor 20 mg daily. Check lipid panel in 1 week.   Given ongoing use of aspirin, will also check CBC.    Hypertension: Fairly well-controlled. Given his diabetes, and absence of anginal symptoms, start metoprolol to tartrate 25 mg twice daily, and instead start him on losartan 25 mg daily. Continue hydrochlorothiazide 25 mg daily. Check BMP in 1 week.    Type 2 diabetes mellitus: Uncontrolled on diagnosis over a year ago with  A1c of 13%. Currently, he is on no medications for diabetes. He does not want to try metformin again due to diarrhea symptoms. He is okay with resuming Jardiance at 25 mg daily. Given that he does not have a PCP currently, I prescribe Jardiance 25 mg daily. Will check A1c. I have urged him to establish care with PCP at the earliest to monitor and treat his diabetes.       No orders of the defined types were placed in this encounter.    F/u in 3 months  Signed, Elder Negus, MD

## 2023-05-17 NOTE — Patient Instructions (Signed)
Medication Instructions:   STOP TAKING METOPROLOL TARTRATE NOW  START TAKING LOSARTAN 25 MG BY MOUTH DAILY  *If you need a refill on your cardiac medications before your next appointment, please call your pharmacy*   Lab Work:  IN ONE WEEK HERE ON THE FIRST FLOOR OF THIS BUILDING AT [facility]--(05/24/23)--CHECK LIPIDS, BMET, AND A1C--PLEASE COME FASTING TO THIS LAB APPOINTMENT  If you have labs (blood work) drawn today and your tests are completely normal, you will receive your results only by: MyChart Message (if you have MyChart) OR A paper copy in the mail If you have any lab test that is abnormal or we need to change your treatment, we will call you to review the results.    Follow-Up:  3 MONTHS WITH AN EXTENDER IN THE OFFICE

## 2023-05-25 ENCOUNTER — Encounter: Payer: Self-pay | Admitting: Cardiology

## 2023-05-25 LAB — LIPID PANEL
Chol/HDL Ratio: 3.3 {ratio} (ref 0.0–5.0)
Cholesterol, Total: 145 mg/dL (ref 100–199)
HDL: 44 mg/dL (ref >39)
LDL Chol Calc (NIH): 77 mg/dL (ref 0–99)
Triglycerides: 137 mg/dL (ref 0–149)
VLDL Cholesterol Cal: 24 mg/dL (ref 5–40)

## 2023-05-25 LAB — BASIC METABOLIC PANEL
BUN/Creatinine Ratio: 11 (ref 10–24)
BUN: 15 mg/dL (ref 8–27)
CO2: 27 mmol/L (ref 20–29)
Calcium: 10 mg/dL (ref 8.6–10.2)
Chloride: 99 mmol/L (ref 96–106)
Creatinine, Ser: 1.35 mg/dL — ABNORMAL HIGH (ref 0.76–1.27)
Glucose: 150 mg/dL — ABNORMAL HIGH (ref 70–99)
Potassium: 4.6 mmol/L (ref 3.5–5.2)
Sodium: 144 mmol/L (ref 134–144)
eGFR: 58 mL/min/{1.73_m2} — ABNORMAL LOW (ref >59)

## 2023-05-27 LAB — HEMOGLOBIN A1C
Est. average glucose Bld gHb Est-mCnc: 315 mg/dL
Hgb A1c MFr Bld: 12.6 % — ABNORMAL HIGH (ref 4.8–5.6)

## 2023-06-21 ENCOUNTER — Encounter: Payer: Self-pay | Admitting: Cardiology

## 2023-08-15 ENCOUNTER — Other Ambulatory Visit: Payer: Self-pay | Admitting: Cardiology

## 2023-08-15 ENCOUNTER — Ambulatory Visit: Payer: Medicare PPO | Admitting: Nurse Practitioner

## 2023-08-15 DIAGNOSIS — I2510 Atherosclerotic heart disease of native coronary artery without angina pectoris: Secondary | ICD-10-CM

## 2023-08-15 DIAGNOSIS — I10 Essential (primary) hypertension: Secondary | ICD-10-CM

## 2023-08-15 DIAGNOSIS — E119 Type 2 diabetes mellitus without complications: Secondary | ICD-10-CM

## 2023-08-15 DIAGNOSIS — E782 Mixed hyperlipidemia: Secondary | ICD-10-CM

## 2023-08-15 NOTE — Telephone Encounter (Signed)
 Pcp needs to fill it.

## 2023-08-16 ENCOUNTER — Other Ambulatory Visit: Payer: Self-pay | Admitting: Cardiology

## 2023-08-16 DIAGNOSIS — E119 Type 2 diabetes mellitus without complications: Secondary | ICD-10-CM

## 2023-08-16 DIAGNOSIS — I2510 Atherosclerotic heart disease of native coronary artery without angina pectoris: Secondary | ICD-10-CM

## 2023-08-16 MED ORDER — EMPAGLIFLOZIN 25 MG PO TABS: 25.0000 mg | ORAL_TABLET | Freq: Every day | ORAL | 0 refills | Status: DC

## 2023-08-16 NOTE — Progress Notes (Signed)
 ICD-10-CM   1. Coronary artery disease involving native coronary artery of native heart without angina pectoris  I25.10 empagliflozin  (JARDIANCE ) 25 MG TABS tablet    2. Type 2 diabetes mellitus without complication, without long-term current use of insulin  (HCC)  E11.9 empagliflozin  (JARDIANCE ) 25 MG TABS tablet     Meds ordered this encounter  Medications   empagliflozin  (JARDIANCE ) 25 MG TABS tablet    Sig: Take 1 tablet (25 mg total) by mouth daily.    Dispense:  30 tablet    Refill:  0    Refills from [employer], PA-C

## 2023-08-16 NOTE — Telephone Encounter (Signed)
 ICD-10-CM   1. Coronary artery disease involving native coronary artery of native heart without angina pectoris  I25.10 empagliflozin  (JARDIANCE ) 25 MG TABS tablet    2. Type 2 diabetes mellitus without complication, without long-term current use of insulin  (HCC)  E11.9 empagliflozin  (JARDIANCE ) 25 MG TABS tablet     Meds ordered this encounter  Medications   empagliflozin  (JARDIANCE ) 25 MG TABS tablet    Sig: Take 1 tablet (25 mg total) by mouth daily.    Dispense:  30 tablet    Refill:  0    Refills from [employer], PA-C

## 2023-08-16 NOTE — Addendum Note (Signed)
 Addended by: Federico Hopkins on: 08/16/2023 03:13 PM   Modules accepted: Orders

## 2023-08-17 ENCOUNTER — Other Ambulatory Visit: Payer: Self-pay | Admitting: Cardiology

## 2023-08-17 DIAGNOSIS — I2510 Atherosclerotic heart disease of native coronary artery without angina pectoris: Secondary | ICD-10-CM

## 2023-08-17 DIAGNOSIS — E119 Type 2 diabetes mellitus without complications: Secondary | ICD-10-CM

## 2023-08-17 MED ORDER — EMPAGLIFLOZIN 25 MG PO TABS: 25.0000 mg | ORAL_TABLET | Freq: Every day | ORAL | 0 refills | Status: AC

## 2023-08-17 MED ORDER — EMPAGLIFLOZIN 25 MG PO TABS: 25.0000 mg | ORAL_TABLET | Freq: Every day | ORAL | 0 refills | Status: DC

## 2023-08-19 ENCOUNTER — Encounter: Payer: Self-pay | Admitting: Cardiology

## 2023-08-19 ENCOUNTER — Ambulatory Visit: Payer: Medicare PPO | Admitting: Nurse Practitioner

## 2023-08-21 ENCOUNTER — Ambulatory Visit: Admitting: Nurse Practitioner

## 2023-09-04 NOTE — Progress Notes (Unsigned)
 Cardiology Office Note    Patient Name: Geoffrey Williams Date of Encounter: 09/04/2023  Primary Care Provider:  Victor Grapes, PA-C Primary Cardiologist:  None Primary Electrophysiologist: None   Past Medical History    Past Medical History:  Diagnosis Date   Arthritis    Asthma    Diabetes mellitus without complication (HCC)    Hypertension     History of Present Illness  Geoffrey Williams is a 68 y.o. male with a PMH of HTN, HLD, family history of CAD who presents today for 39-month follow-up.  Geoffrey Williams was seen initially by Dr. Audery Williams 02/2020 for chest pain.  He reported an episode of retrosternal left-sided chest discomfort with shortness of breath.  Strong family history of CAD with multiple members undergoing bypass surgery.  Had a 2D echo that showed with trace AR and mild TR.  He also completed an exercise Myoview that was normal and low risk.  He completed a CT calcium  score on 06/2021 that showed calcium  score of 88.6 with minimal stenosis in the LAD.  He was continued on ASA 81 mg and Crestor  20 mg.  He was last seen on 05/13/2023 for follow-up.  During visit he denied any chest pain or shortness of breath and blood pressure was fairly controlled and metoprolol  was switched to tartrate 25 mg twice daily and losartan  25 mg daily.  Patient denies chest pain, palpitations, dyspnea, PND, orthopnea, nausea, vomiting, dizziness, syncope, edema, weight gain, or early satiety.   Discussed the use of AI scribe software for clinical note transcription with the patient, who gave verbal consent to proceed.  History of Present Illness    ***Notes:   Review of Systems  Please see the history of present illness.    All other systems reviewed and are otherwise negative except as noted above.  Physical Exam     Wt Readings from Last 3 Encounters:  05/17/23 165 lb (74.8 kg)  09/05/22 160 lb (72.6 kg)  07/30/22 160 lb (72.6 kg)   [id number] were no vitals filed for this visit.,There  is no height or weight on file to calculate BMI. GEN: Well nourished, well developed in no acute distress Neck: No JVD; No carotid bruits Pulmonary: Clear to auscultation without rales, wheezing or rhonchi  Cardiovascular: Normal rate. Regular rhythm. Normal S1. Normal S2.   Murmurs: There is no murmur.  ABDOMEN: Soft, non-tender, non-distended EXTREMITIES:  No edema; No deformity   EKG/LABS/ Recent Cardiac Studies   ECG personally reviewed by me today - ***  Risk Assessment/Calculations:   {Does this patient have ATRIAL FIBRILLATION?[phone]}      Lab Results  Component Value Date   WBC 10.8 (H) 10/10/2021   HGB 15.8 10/10/2021   HCT 43.4 10/10/2021   MCV 85.6 10/10/2021   PLT 185 10/10/2021   Lab Results  Component Value Date   CREATININE 1.35 (H) 05/24/2023   BUN 15 05/24/2023   NA 144 05/24/2023   K 4.6 05/24/2023   CL 99 05/24/2023   CO2 27 05/24/2023   Lab Results  Component Value Date   CHOL 145 05/24/2023   HDL 44 05/24/2023   LDLCALC 77 05/24/2023   TRIG 137 05/24/2023   CHOLHDL 3.3 05/24/2023    Lab Results  Component Value Date   HGBA1C 12.6 (H) 05/24/2023   Assessment & Plan    Assessment and Plan Assessment & Plan     1.  Coronary artery disease  2.  Essential hypertension  3.  Hyperlipidemia  4.  DM type II      Disposition: Follow-up with None or APP in *** months {Are you ordering a CV Procedure (e.g. stress test, cath, DCCV, TEE, etc)?   Press F2        :[id number]}   Signed, Geoffrey Williams, Geoffrey Cast, NP 09/04/2023, 1:11 PM [facility]

## 2023-09-05 ENCOUNTER — Encounter: Payer: Self-pay | Admitting: Nurse Practitioner

## 2023-09-05 ENCOUNTER — Ambulatory Visit: Attending: Nurse Practitioner | Admitting: Nurse Practitioner

## 2023-09-05 VITALS — BP 118/70 | HR 70 | Ht 72.0 in | Wt 158.0 lb

## 2023-09-05 DIAGNOSIS — I10 Essential (primary) hypertension: Secondary | ICD-10-CM | POA: Diagnosis not present

## 2023-09-05 DIAGNOSIS — I2510 Atherosclerotic heart disease of native coronary artery without angina pectoris: Secondary | ICD-10-CM | POA: Diagnosis not present

## 2023-09-05 DIAGNOSIS — E782 Mixed hyperlipidemia: Secondary | ICD-10-CM

## 2023-09-05 DIAGNOSIS — E119 Type 2 diabetes mellitus without complications: Secondary | ICD-10-CM | POA: Diagnosis not present

## 2023-09-05 NOTE — Patient Instructions (Signed)
 Medication Instructions:  Your physician recommends that you continue on your current medications as directed. Please refer to the Current Medication list given to you today. *If you need a refill on your cardiac medications before your next appointment, please call your pharmacy*  Lab Work: None ordered If you have labs (blood work) drawn today and your tests are completely normal, you will receive your results only by: MyChart Message (if you have MyChart) OR A paper copy in the mail If you have any lab test that is abnormal or we need to change your treatment, we will call you to review the results.  Testing/Procedures: None ordered  Follow-Up: At [facility], you and your health needs are our priority.  As part of our continuing mission to provide you with exceptional heart care, our providers are all part of one team.  This team includes your primary Cardiologist (physician) and Advanced Practice Providers or APPs (Physician Assistants and Nurse Practitioners) who all work together to provide you with the care you need, when you need it.  Your next appointment:   6 month(s)  Provider:   Fransico Ivy, MD or Charles Connor, NP    We recommend signing up for the patient portal called "MyChart".  Sign up information is provided on this After Visit Summary.  MyChart is used to connect with patients for Virtual Visits (Telemedicine).  Patients are able to view lab/test results, encounter notes, upcoming appointments, etc.  Non-urgent messages can be sent to your provider as well.   To learn more about what you can do with MyChart, go to [email or web address].   Other Instructions
